# Patient Record
Sex: Female | Born: 1980 | Hispanic: Yes | Marital: Single | Smoking: Current every day smoker
Health system: Southern US, Community
[De-identification: ages and names within clinical notes are randomized; demographics above are authoritative.]

## PROBLEM LIST (undated history)

## (undated) ENCOUNTER — Inpatient Hospital Stay (HOSPITAL_COMMUNITY): Payer: Self-pay

## (undated) DIAGNOSIS — B009 Herpesviral infection, unspecified: Secondary | ICD-10-CM

## (undated) DIAGNOSIS — E079 Disorder of thyroid, unspecified: Secondary | ICD-10-CM

## (undated) DIAGNOSIS — E039 Hypothyroidism, unspecified: Secondary | ICD-10-CM

## (undated) DIAGNOSIS — F32A Depression, unspecified: Secondary | ICD-10-CM

## (undated) DIAGNOSIS — F99 Mental disorder, not otherwise specified: Secondary | ICD-10-CM

## (undated) DIAGNOSIS — O139 Gestational [pregnancy-induced] hypertension without significant proteinuria, unspecified trimester: Secondary | ICD-10-CM

## (undated) DIAGNOSIS — F329 Major depressive disorder, single episode, unspecified: Secondary | ICD-10-CM

## (undated) HISTORY — PX: CHOLECYSTECTOMY: SHX55

## (undated) HISTORY — DX: Hypothyroidism, unspecified: E03.9

## (undated) HISTORY — PX: DILATION AND CURETTAGE OF UTERUS: SHX78

## (undated) HISTORY — DX: Disorder of thyroid, unspecified: E07.9

---

## 2007-07-03 ENCOUNTER — Inpatient Hospital Stay (HOSPITAL_COMMUNITY): Admission: AD | Admit: 2007-07-03 | Discharge: 2007-07-03 | Payer: Self-pay | Admitting: Obstetrics & Gynecology

## 2007-07-03 ENCOUNTER — Other Ambulatory Visit: Payer: Self-pay | Admitting: Emergency Medicine

## 2007-07-06 ENCOUNTER — Inpatient Hospital Stay (HOSPITAL_COMMUNITY): Admission: AD | Admit: 2007-07-06 | Discharge: 2007-07-06 | Payer: Self-pay | Admitting: Gynecology

## 2007-07-13 ENCOUNTER — Inpatient Hospital Stay (HOSPITAL_COMMUNITY): Admission: AD | Admit: 2007-07-13 | Discharge: 2007-07-13 | Payer: Self-pay | Admitting: Obstetrics and Gynecology

## 2007-07-20 ENCOUNTER — Ambulatory Visit: Payer: Self-pay | Admitting: Obstetrics & Gynecology

## 2007-07-20 ENCOUNTER — Ambulatory Visit (HOSPITAL_COMMUNITY): Admission: AD | Admit: 2007-07-20 | Discharge: 2007-07-20 | Payer: Self-pay | Admitting: Obstetrics & Gynecology

## 2007-07-20 ENCOUNTER — Encounter: Payer: Self-pay | Admitting: Obstetrics & Gynecology

## 2007-07-27 ENCOUNTER — Inpatient Hospital Stay (HOSPITAL_COMMUNITY): Admission: AD | Admit: 2007-07-27 | Discharge: 2007-07-27 | Payer: Self-pay | Admitting: Obstetrics and Gynecology

## 2007-08-10 ENCOUNTER — Ambulatory Visit: Payer: Self-pay | Admitting: Family Medicine

## 2009-10-06 ENCOUNTER — Ambulatory Visit: Payer: Self-pay | Admitting: Family Medicine

## 2009-10-06 ENCOUNTER — Encounter: Payer: Self-pay | Admitting: Family Medicine

## 2009-10-06 LAB — CONVERTED CEMR LAB
Basophils Absolute: 0 10*3/uL (ref 0.0–0.1)
Hemoglobin: 12.9 g/dL (ref 12.0–15.0)
Hepatitis B Surface Ag: NEGATIVE
Lymphocytes Relative: 26 % (ref 12–46)
Monocytes Absolute: 0.4 10*3/uL (ref 0.1–1.0)
Neutro Abs: 4.9 10*3/uL (ref 1.7–7.7)
Platelets: 195 10*3/uL (ref 150–400)
RDW: 14.4 % (ref 11.5–15.5)

## 2009-10-13 ENCOUNTER — Encounter: Payer: Self-pay | Admitting: Family Medicine

## 2009-10-13 ENCOUNTER — Ambulatory Visit: Payer: Self-pay | Admitting: Family Medicine

## 2009-10-13 LAB — CONVERTED CEMR LAB
Chlamydia, DNA Probe: NEGATIVE
GC Probe Amp, Genital: NEGATIVE
Pap Smear: NEGATIVE
Whiff Test: NEGATIVE

## 2009-10-17 ENCOUNTER — Encounter: Payer: Self-pay | Admitting: *Deleted

## 2009-10-22 ENCOUNTER — Ambulatory Visit: Payer: Self-pay | Admitting: Family Medicine

## 2009-10-23 ENCOUNTER — Encounter: Payer: Self-pay | Admitting: Family Medicine

## 2009-10-24 ENCOUNTER — Encounter: Payer: Self-pay | Admitting: Family Medicine

## 2009-10-27 ENCOUNTER — Telehealth: Payer: Self-pay | Admitting: Family Medicine

## 2009-11-06 ENCOUNTER — Ambulatory Visit: Payer: Self-pay | Admitting: Family Medicine

## 2009-11-26 ENCOUNTER — Ambulatory Visit: Payer: Self-pay | Admitting: Family Medicine

## 2009-11-26 DIAGNOSIS — Z8619 Personal history of other infectious and parasitic diseases: Secondary | ICD-10-CM

## 2009-12-16 ENCOUNTER — Ambulatory Visit: Payer: Self-pay | Admitting: Family Medicine

## 2009-12-18 ENCOUNTER — Ambulatory Visit: Payer: Self-pay | Admitting: Family Medicine

## 2009-12-18 ENCOUNTER — Encounter: Payer: Self-pay | Admitting: Family Medicine

## 2010-01-02 ENCOUNTER — Encounter: Payer: Self-pay | Admitting: Family Medicine

## 2010-01-02 ENCOUNTER — Ambulatory Visit: Payer: Self-pay | Admitting: Family Medicine

## 2010-01-02 LAB — CONVERTED CEMR LAB
Hemoglobin: 12.9 g/dL (ref 12.0–15.0)
MCHC: 33.2 g/dL (ref 30.0–36.0)
MCV: 93.7 fL (ref 78.0–100.0)
RBC: 4.14 M/uL (ref 3.87–5.11)

## 2010-01-06 ENCOUNTER — Telehealth: Payer: Self-pay | Admitting: *Deleted

## 2010-01-16 ENCOUNTER — Ambulatory Visit: Payer: Self-pay | Admitting: Family Medicine

## 2010-01-19 ENCOUNTER — Telehealth: Payer: Self-pay | Admitting: *Deleted

## 2010-01-28 ENCOUNTER — Ambulatory Visit: Payer: Self-pay | Admitting: Family Medicine

## 2010-02-12 ENCOUNTER — Ambulatory Visit: Payer: Self-pay | Admitting: Family Medicine

## 2010-02-13 ENCOUNTER — Telehealth: Payer: Self-pay | Admitting: Family Medicine

## 2010-02-19 ENCOUNTER — Encounter: Payer: Self-pay | Admitting: Family Medicine

## 2010-02-19 ENCOUNTER — Ambulatory Visit: Payer: Self-pay | Admitting: Family Medicine

## 2010-02-20 ENCOUNTER — Telehealth: Payer: Self-pay | Admitting: Family Medicine

## 2010-02-26 ENCOUNTER — Encounter: Payer: Self-pay | Admitting: Family Medicine

## 2010-02-26 ENCOUNTER — Ambulatory Visit: Payer: Self-pay | Admitting: Family Medicine

## 2010-02-26 LAB — CONVERTED CEMR LAB
AST: 18 units/L (ref 0–37)
BUN: 5 mg/dL — ABNORMAL LOW (ref 6–23)
Calcium: 8.6 mg/dL (ref 8.4–10.5)
Chloride: 108 meq/L (ref 96–112)
Creatinine, Ser: 0.66 mg/dL (ref 0.40–1.20)
Glucose, Bld: 59 mg/dL — ABNORMAL LOW (ref 70–99)
HCT: 40.2 % (ref 36.0–46.0)
Hemoglobin: 13.5 g/dL (ref 12.0–15.0)
Nitrite: NEGATIVE
Protein, U semiquant: >=300
RBC: 4.32 M/uL (ref 3.87–5.11)
RDW: 13.5 % (ref 11.5–15.5)
Urobilinogen, UA: 0.2
WBC: 8.3 10*3/uL (ref 4.0–10.5)

## 2010-02-27 ENCOUNTER — Ambulatory Visit: Payer: Self-pay | Admitting: Family Medicine

## 2010-02-27 ENCOUNTER — Inpatient Hospital Stay (HOSPITAL_COMMUNITY)
Admission: AD | Admit: 2010-02-27 | Discharge: 2010-03-05 | Payer: Self-pay | Source: Home / Self Care | Admitting: Family Medicine

## 2010-02-27 ENCOUNTER — Telehealth: Payer: Self-pay | Admitting: Family Medicine

## 2010-03-02 ENCOUNTER — Encounter: Payer: Self-pay | Admitting: Obstetrics & Gynecology

## 2010-03-06 ENCOUNTER — Ambulatory Visit: Payer: Self-pay | Admitting: Family Medicine

## 2010-03-11 ENCOUNTER — Ambulatory Visit: Payer: Self-pay | Admitting: Family Medicine

## 2010-03-11 ENCOUNTER — Encounter: Payer: Self-pay | Admitting: Family Medicine

## 2010-03-11 LAB — CONVERTED CEMR LAB
AST: 19 units/L (ref 0–37)
Albumin: 3.6 g/dL (ref 3.5–5.2)
Alkaline Phosphatase: 139 units/L — ABNORMAL HIGH (ref 39–117)
Ketones, urine, test strip: NEGATIVE
Nitrite: NEGATIVE
Potassium: 4.1 meq/L (ref 3.5–5.3)
Protein, U semiquant: NEGATIVE
Sodium: 140 meq/L (ref 135–145)
Total Bilirubin: 0.4 mg/dL (ref 0.3–1.2)
Total Protein: 7.3 g/dL (ref 6.0–8.3)
Urobilinogen, UA: 0.2

## 2010-04-15 ENCOUNTER — Ambulatory Visit: Payer: Self-pay | Admitting: Family Medicine

## 2010-05-19 NOTE — Assessment & Plan Note (Signed)
Summary: ob visit/eo   Vital Signs:  Patient profile:   30 year old female Weight:      146.8 pounds BP sitting:   119 / 64  Vitals Entered By: Arlyss Repress CMA, (February 19, 2010 10:55 AM)  Primary Care Provider:  Clementeen Graham MD   History of Present Illness: Visit conducted in Spanish, in [facility] clinic.  [name] is a 30 yr old G4P0030 at 28 6/[redacted] weeks EGA, here today for followup OB visit. She denies changes in her social situation since last week's visit (also in OB clinic).  Still living with her sister's family, some tension in the household but feels safe there.  Does not want her sister present for the delivery.  There is no one whom she knows who will be present for her delivery.  Would be interested in doula service if one is available.  Discussed her history of OB US, which she had done at Dr Elsie Stain office.  The report of the July 8th [place] was requested last week; thus far I have not seen the report of that ultrasound.  Habits & Providers  Alcohol-Tobacco-Diet     Cigarette Packs/Day: n/a  Allergies: No Known Drug Allergies  Physical Exam  General:  well appearing, no apparent distress Genitalia:  normal vaginal mucosa, no lesions or vesicles.  Normal cervix, thin white discharge upon insertion of speculum for cervical culture collection.   Impression & Recommendations:  Problem # 1:  SUPERVISION OF OTHER NORMAL PREGNANCY (ICD-V22.1) G4P0030 at 35 6/7 weeks by LMP; she had an anatomy US done at Dr Whitney Post office on July 8th, will obtain copies of the reports form that study to verify dates.  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  Discussed that she is to call with any suspicious lesions or sensation of new outbreak.  In context of previous HSV2, we performed cervical cultures along with her routine GBS culture today.  For followup in 1 week with her primary, Dr [name].    Orders: Grp B Probe-[facility] ([id number]) GC/Chlamydia-[facility] ([id number]) Grp B Probe-[facility] ([id number])  Problem # 2:  HAND PAIN, BILATERAL (ICD-729.5) Bilateral carpal tunnel syndrome.  Continue using cock-up splints at night.  Tylenol as needed.    Complete Medication List: 1)  Prenatal/folic Acid Tabs (Prenatal vit-fe fumarate-fa) 2)  Acyclovir 400 Mg Tabs (Acyclovir) .... Sig: take 1 tab by mouth three times daily until delivery spanish lang instructions 3)  Clotrimazole 1 % Crea (Clotrimazole) .... Apply to affected skin twice daily for 7 days disp 1 large tube spanish language instructions  Patient Instructions: 1)  Fue un placer verle hoy.  Tiene 35 semanas con 6 dias de embarazo.  Todo parece estar encaminando bien. 2)  Hicimos colecta de cultivos del cuello de la matriz y de la piel alrededor de la vagina hoy.  3)  Pedimos su firma para recibir el reporte del [employer] hicieron en la oficina del Dr Gaynell Face el 8 de julio.  4)  Siga tomando la medicina acyclovir tres veces por dia, hasta que de' a luz.  Por favor llame si se le brotan lesiones que parecen ser de herpes.  5)  Cada manana y noche enfoquese en los movimientos del bebe.  Si siente por lo  menos cinco movimientos en una hora, no tiene que seguir contando.  Si no llega a cinco movimientos en [name],  siga contando por otra hora.  Si no llega a diez movimientos en estas dos horas, debe presentarse al [employer] de Emergencia de [facility] o llamar a Event organiser. 6)  FOLLOW UP OB VISIT WITH DR COREY IN 1 WEEK.   Orders Added: 1)  Grp B Probe-[facility] [[id number]] 2)  GC/Chlamydia-[facility] [[id number]] 3)  Grp B Probe-[facility] [[id number]]     OB Initial Intake Information    Positive HCG by: self    Race: Hispanic    Marital status: Single  FOB Information    Husband/Father of baby: [name]  Menstrual History    LMP (date): 06/13/2009    LMP - Character: normal    Menarche: 11 years    Menses  interval: 30 days    Menstrual flow 3-8 days    On BCP's at conception: no    Date of positive (+) home preg. test: 09/17/2009   Flowsheet View for Follow-up Visit    Estimated weeks of       gestation:     35 6/7    Weight:     146.8    Blood pressure:   119 / 64    Headache:     No    Nausea/vomiting:   No    Edema:     0    Vaginal bleeding:   no    Vaginal discharge:   no    Fundal height:      36    FHR:       130    Fetal activity:     yes    Labor symptoms:   no    Fetal position:     vertex    Taking prenatal vits?   Y    Smoking:     n/a    Next visit:     1 wk    Preceptor:     Mauricio Po    Comment:     see CPOE    Flowsheet View for Follow-up Visit    Estimated weeks of       gestation:     35 6/7    Weight:     146.8    Blood pressure:   119 / 64    Hx headache?     No    Nausea/vomiting?   No    Edema?     0    Bleeding?     no    Leakage/discharge?   no    Fetal activity:       yes    Labor symptoms?   no    Fundal height:      36    FHR:       130    Fetal position:      vertex    Taking Vitamins?   Y    Smoking PPD:   n/a    Comment:     see CPOE    Next visit:     1 wk    Preceptor:     Mauricio Po  Appended Document: ob visit/eo    Clinical Lists Changes  Orders: Added new Test order of Other OB visit- [facility]) - Signed

## 2010-05-19 NOTE — Assessment & Plan Note (Signed)
Summary: ob/aam/kh   Vital Signs:  Patient profile:   30 year old female Weight:      128.1 pounds Pulse rate:   74 / minute BP sitting:   88 / 50  Vitals Entered By: Garen Grams LPN (November 26, 2009 9:37 AM)  Primary Care Provider:  Clementeen Graham MD  CC:  OB visit.  History of Present Illness: Ms C-M presents to clinic today for a 23and 5 week OB visit. In the interum she has been seen by [facility] and sent back to [facility] for further care. No special intervantion or monitoring required.  However no reccord were sent. Calling to obtain today. Also had an anatomy scan which was normal per patient. She will be having a girl named Macao.  No reccords sent and are called to obtain now.  No red flag symptoms. Feels well and no issues. Continues to work. Happy and healthy.   Habits & Providers  Alcohol-Tobacco-Diet     Alcohol drinks/day: 0     Tobacco Status: never     Cigarette Packs/Day: n/a  Current Problems (verified): 1)  Genital Herpes, Hx of  (ICD-V13.8) 2)  Pregnancy With History of Abortion  (ICD-V23.2)  Current Medications (verified): 1)  Prenatal/folic Acid  Tabs (Prenatal Vit-Fe Fumarate-Fa)  Allergies (verified): No Known Drug Allergies  Past History:  Past Surgical History: Last updated: 10/13/2009 None  Social History: Last updated: 11/26/2009 Has been in the [place] for a few years. FOB/Partner was deported few months ago.   Currently living with her sisters and aunts.  Feels safe at home and well loved.  Works.   Past Medical History: 4 SABs 2 requiring D&C.  Genital hepes last outbreak 6 years ago. "were removed with lazer" question Condoloyma? Othwerwise normal.   Family History: Reviewed history from 10/13/2009 and no changes required. None  Social History: Has been in the [place] for a few years. FOB/Partner was deported few months ago.   Currently living with her sisters and aunts.  Feels safe at home and well loved.  Works.   Review of Systems  The  patient denies anorexia, weight loss, chest pain, syncope, dyspnea on exertion, peripheral edema, headaches, abdominal pain, incontinence, genital sores, transient blindness, difficulty walking, unusual weight change, and enlarged lymph nodes.    Physical Exam  General:  VS noted.  Well woman with NAD Mouth:  Oral mucosa and oropharynx without lesions or exudates.  Teeth in good repair. Lungs:  Normal respiratory effort, chest expands symmetrically. Lungs are clear to auscultation, no crackles or wheezes. Heart:  Normal rate and regular rhythm. S1 and S2 normal without gallop, murmur, click, rub or other extra sounds. Abdomen:  Gravid 23 cm Extremities:  Non edemetus BL LE   Impression & Recommendations:  Problem # 1:  PREGNANCY WITH HISTORY OF ABORTION (ICD-V23.2) Assessment Unchanged  Doing well. Obtaining reccords from [facility] and Dr. Trudie Buckler to confirm no intervention required.  Plan to follow up in 2-3 week (28 weeks) for Glucola.  Will initiate Valtrex 500 daily at 34 weeks.  Normal prenatal care.   Orders: Other OB visit- [facility] ([facility])  Complete Medication List: 1)  Prenatal/folic Acid Tabs (Prenatal vit-fe fumarate-fa)  Patient Instructions: 1)  Thank you for seeing me today. 2)  Return to clinic in 3 weeks.  3)  Make your appt for early in the clinic. 4)  Cada manana y noche enfoquese en los movimientos del bebe.  Si siente por lo  menos cinco movimientos en [place]  hora, no tiene que seguir contando.  Si no llega a cinco movimientos en una hora, siga contando por otra hora.  Si no llega a diez movimientos en estas dos horas, debe presentarse al [employer] de Emergencia de [facility] o llamar a Event organiser.   OB Initial Intake Information    Positive HCG by: self    Race: Hispanic    Marital status: Single  FOB Information    Husband/Father of baby: [name]  Menstrual History    LMP (date): 06/13/2009    LMP - Character: normal    Menarche: 11 years     Menses interval: 30 days    Menstrual flow 3-8 days    On BCP's at conception: no    Date of positive (+) home preg. test: 09/17/2009   Flowsheet View for Follow-up Visit    Estimated weeks of       gestation:     23 5/7    Weight:     128.1    Blood pressure:   88 / 50    Headache:     No    Nausea/vomiting:   No    Edema:     0    Vaginal bleeding:   no    Vaginal discharge:   no    Fundal height:      23    FHR:       140    Fetal activity:     yes    Labor symptoms:   no    Fetal position:     N/A    Taking prenatal vits?   Y    Smoking:     n/a    Next visit:     2-3 weeks    Resident:     [name]    Preceptor:     Mauricio Po   Appended Document: ob/aam/kh u/s results placed in pcp box also placed in to be scanned slot

## 2010-05-19 NOTE — Assessment & Plan Note (Signed)
Summary: ob visit/eo   Vital Signs:  Patient profile:   30 year old female Weight:      131 pounds Temp:     99.0 degrees F oral Pulse rate:   87 / minute BP sitting:   97 / 62  Vitals Entered By: Jimmy Footman, CMA (December 16, 2009 1:47 PM) CC: OB 26   4/7.   Comments Has started to have some upper back pain   Primary Care Jarmon Javid:  Clementeen Graham MD  CC:  OB 26   4/7.  Marland Kitchen  History of Present Illness: Doing well at 26 and 4/7 weeks.  Only complaint is thoracic back pain. Mild Notes with exertion and duration of 1 week. Better with stretching and rest. Has not tried anything for it yet. Lifts up tto 50 lbs at work. But feels isnt straning much. No other issues.  See flowsheet.   Habits & Providers  Alcohol-Tobacco-Diet     Alcohol drinks/day: 0     Tobacco Status: never     Cigarette Packs/Day: n/a  Current Problems (verified): 1)  Genital Herpes, Hx of  (ICD-V13.8) 2)  Pregnancy With History of Abortion  (ICD-V23.2)  Current Medications (verified): 1)  Prenatal/folic Acid  Tabs (Prenatal Vit-Fe Fumarate-Fa)  Allergies (verified): No Known Drug Allergies  Past History:  Past Medical History: Last updated: 11/26/2009 4 SABs 2 requiring D&C.  Genital hepes last outbreak 6 years ago. "were removed with lazer" question Condoloyma? Othwerwise normal.   Past Surgical History: Last updated: 10/13/2009 None  Social History: Last updated: 11/26/2009 Has been in the [place] for a few years. FOB/Partner was deported few months ago.   Currently living with her sisters and aunts.  Feels safe at home and well loved.  Works.   Risk Factors: Smoking Status: never (12/16/2009) Packs/Day: n/a (12/16/2009)  Family History: Reviewed history from 10/13/2009 and no changes required. None  Social History: Reviewed history from 11/26/2009 and no changes required. Has been in the [place] for a few years. FOB/Partner was deported few months ago.   Currently living with her sisters and  aunts.  Feels safe at home and well loved.  Works.   Review of Systems       See HPI otherwsie neg  Physical Exam  General:  Vs noted.  Well gravid woman in NAD Lungs:  Normal respiratory effort, chest expands symmetrically. Lungs are clear to auscultation, no crackles or wheezes. Heart:  Normal rate and regular rhythm. S1 and S2 normal without gallop, murmur, click, rub or other extra sounds. Abdomen:  Gravid 27 cm Msk:  Mild TTP across the toracic back especially in the paraspinus muscle groups and Bl romboids.  Normal scapular and toracic ROM.  No spinus process TTP Extremities:  Non edemetus BL LE   Impression & Recommendations:  Problem # 1:  PREGNANCY WITH HISTORY OF ABORTION (ICD-V23.2) Assessment Unchanged Doing well. No issues. Plan 1 hr GTT today and Visit at 28 weeks for HIV/RPR testing then.  Will follow.  Advised tylenol for minor MSK pain.  Red flags reviewed.   Orders: Glucose 1 hr-[facility] ([id number]) Other OB visit- [facility] ([facility])  Complete Medication List: 1)  Prenatal/folic Acid Tabs (Prenatal vit-fe fumarate-fa)  Patient Instructions: 1)  Thank you for seeing me today. 2)  Please schedule a follow-up appointment in 2 weeks. 3)  Si tiene sangrado vaginal, o si sospecha que se le ha roto la fuente (chorro fuerte o un goteo leve de la vagina) o si tiene English as a second language teacher  que vienen mas de cada cinco minutos por una horas seguidas, por favor presentese al Narda Rutherford de Emergencia de [facility]. 4)  Cada manana y noche enfoquese en los movimientos del bebe.  Si siente por lo  menos cinco movimientos en una hora, no tiene que seguir contando.  Si no llega a cinco movimientos en una hora, siga contando por otra hora.  Si no llega a diez movimientos en estas dos horas, debe presentarse al [employer] de Emergencia de [facility] o llamar a Event organiser.   OB Initial Intake Information    Positive HCG by: self    Race: Hispanic    Marital status: Single  FOB  Information    Husband/Father of baby: [name]  Menstrual History    LMP (date): 06/13/2009    LMP - Character: normal    Menarche: 11 years    Menses interval: 30 days    Menstrual flow 3-8 days    On BCP's at conception: no    Date of positive (+) home preg. test: 09/17/2009   Flowsheet View for Follow-up Visit    Estimated weeks of       gestation:     26 4/7    Weight:     131    Blood pressure:   97 / 62    Headache:     No    Nausea/vomiting:   No    Edema:     0    Vaginal bleeding:   no    Vaginal discharge:   no    Fundal height:      27    FHR:       145    Fetal activity:     yes    Labor symptoms:   no    Fetal position:     breech    Taking prenatal vits?   Y    Smoking:     n/a    Next visit:     2 wk    Resident:     [name]    Preceptor:     [facility] for Follow-up Visit    Estimated weeks of       gestation:     26 4/7    Weight:     131    Blood pressure:   97 / 62    Hx headache?     No    Nausea/vomiting?   No    Edema?     0    Bleeding?     no    Leakage/discharge?   no    Fetal activity:       yes    Labor symptoms?   no    Fundal height:      27    FHR:       145    Fetal position:      breech    Taking Vitamins?   Y    Smoking PPD:   n/a    Next visit:     2 wk    Resident:     [name]    Preceptor:     Jennette Kettle

## 2010-05-19 NOTE — Consult Note (Signed)
Summary: [name]   Imported By: Bradly Bienenstock 03/06/2010 16:33:55  _____________________________________________________________________  External Attachment:    Type:   Image     Comment:   External Document

## 2010-05-19 NOTE — Progress Notes (Signed)
  Phone Note Outgoing Call   Call placed by: Paula Compton MD,  February 13, 2010 1:00 PM Call placed to: Patient Summary of Call: Call completed in Spanish. Spoke with patient.  I am unable to locate a report for [place] study.  She had an [place] at Dr. Elsie Stain office on July 8th, 2011 (shortly before going to [facility]).  We will request a copy of the report from Dr Elsie Stain office.  Initial call taken by: Paula Compton MD,  February 13, 2010 1:02 PM  Follow-up for Phone Call        called and they faxed it .  will be scanned in. Follow-up by: De Nurse,  February 13, 2010 3:20 PM

## 2010-05-19 NOTE — Progress Notes (Signed)
  Phone Note Outgoing Call   Call placed by: Paula Compton MD,  February 20, 2010 8:42 AM Call placed to: Patient Summary of Call: Called [phone],  phone conversation with patient in Spanish.  I informed her that her cervical cultures (GC/Chlamydia) are negative.  She has no questions.  Initial call taken by: Paula Compton MD,  February 20, 2010 8:43 AM

## 2010-05-19 NOTE — Assessment & Plan Note (Signed)
Summary: pp ck,df   Vital Signs:  Patient profile:   30 year old female Weight:      128 pounds Pulse rate:   73 / minute BP sitting:   115 / 73  (left arm) Cuff size:   regular  Vitals Entered By: Tessie Fass CMA (March 11, 2010 9:15 AM) CC: postpartum check Pain Assessment Patient in pain? no        Primary Care Provider:  Clementeen Graham MD  CC:  postpartum check.  History of Present Illness: [name] presents back to [facility] today for a post partum check. She was recently discharged from [facility] following a C-section and induction for pre-eclampsia and protinurea.  She feels well today and notes that her swelling in her feet is much decreased. She denies any further headaches or vision changes. Her breasts are no longer firm or tender. She feels well.   Habits & Providers  Alcohol-Tobacco-Diet     Alcohol drinks/day: 0     Tobacco Status: never     Cigarette Packs/Day: n/a  Current Problems (verified): 1)  Pre-eclampsia  (ICD-642.40) 2)  Proteinuria  (ICD-791.0) 3)  Abnormal Maternal Glucose Tolerance Antepartum  (ZOX-096.04) 4)  Genital Herpes, Hx of  (ICD-V13.8) 5)  Pregnancy With History of Abortion  (ICD-V23.2)  Current Medications (verified): 1)  Prenatal/folic Acid  Tabs (Prenatal Vit-Fe Fumarate-Fa)  Allergies (verified): No Known Drug Allergies  Past History:  Social History: Last updated: 11/26/2009 Has been in the [place] for a few years. FOB/Partner was deported few months ago.   Currently living with her sisters and aunts.  Feels safe at home and well loved.  Works.   Risk Factors: Smoking Status: never (03/11/2010) Packs/Day: n/a (03/11/2010)  Past Medical History: Preeclampsia with induction at 37 weeks in 02/2010 4 SABs 2 requiring D&C.  Genital hepes last outbreak 6 years ago. "were removed with lazer" question Condoloyma?  Past Surgical History: Primary LTCS 02/2010  Review of Systems  The patient denies anorexia, fever, weight  gain, chest pain, headaches, abdominal pain, and genital sores.    Physical Exam  General:  VS noted.  Well NAD Lungs:  Normal respiratory effort, chest expands symmetrically. Lungs are clear to auscultation, no crackles or wheezes. Heart:  Normal rate and regular rhythm. S1 and S2 normal without gallop, murmur, click, rub or other extra sounds. Abdomen:  Incision is CDI and fundus is firm below the umbilicus.  Extremities:  1+ edema in BL LE now   Impression & Recommendations:  Problem # 1:  PRE-ECLAMPSIA (ICD-642.40) Assessment Improved Much improved clinically.  Will check UA and CMP today for improvement.  Will follow up in 4 weeks at 6 weeks check.    Orders: Urinalysis-[facility] (00000) Comp Met-[facility] ([id number])  Problem # 2:  PREGNANCY WITH HISTORY OF ABORTION (ICD-V23.2) Assessment: Improved  Doing well. Incision is CDI. Looks good.   Orders: Postpartum visit- [facility] [phone])  Complete Medication List: 1)  Prenatal/folic Acid Tabs (Prenatal vit-fe fumarate-fa)   Orders Added: 1)  Urinalysis-[facility] [00000] 2)  Comp Met-[facility] [[id number]] 3)  Postpartum visit- [facility] [[id number]]    Laboratory Results   Urine Tests  Date/Time Received: March 11, 2010 9:12 AM  Date/Time Reported: March 11, 2010 10:43 AM   Routine Urinalysis   Color: yellow Appearance: Clear Glucose: negative   (Normal Range: Negative) Bilirubin: negative   (Normal Range: Negative) Ketone: negative   (Normal Range: Negative) Spec. Gravity: 1.020   (Normal Range: 1.003-1.035) Blood: moderate   (Normal Range:  Negative) pH: 7.0   (Normal Range: 5.0-8.0) Protein: negative   (Normal Range: Negative) Urobilinogen: 0.2   (Normal Range: 0-1) Nitrite: negative   (Normal Range: Negative) Leukocyte Esterace: negative   (Normal Range: Negative)  Urine Microscopic WBC/HPF: 1-3 RBC/HPF: 0-3 Bacteria/HPF: trace Epithelial/HPF: 1-3    Comments: ...............test performed by......Marland KitchenBonnie A. [place],  MLS (ASCP)cm

## 2010-05-19 NOTE — Assessment & Plan Note (Signed)
Summary: OB/MJ(resch'd from 11/1)bmc   Vital Signs:  Patient profile:   30 year old female Weight:      142 pounds Pulse rate:   69 / minute BP sitting:   108 / 67  (left arm) Cuff size:   regular  Vitals Entered By: Arlyss Repress CMA, (February 12, 2010 11:52 AM)  Primary Care Provider:  Clementeen Graham MD   History of Present Illness: Visit conducted in Spanish.  [name] reports feeling well physically; good fetal movement.  Has been having tension with her sister, who wants [name] to care for sister's children ([name]'s nephews).  Husband was deported.   Discussed PHQ9 screen, which is scored at 13, with zero points for question #9.   Continues to take PNVs.   Passed 3hrGTT.  Has had 3 SABs in first trimester in the past; Factor V Leiden negative recently.  Denies bleeding/discharge at this time.  Reports that she has had some irritation and redness in the skin around the vagina/vulva.  Not sexually active now.   Habits & Providers  Alcohol-Tobacco-Diet     Cigarette Packs/Day: n/a  Allergies: No Known Drug Allergies  Physical Exam  Genitalia:  mild erythema of skin around labia majora, inguinal creases.  No visible vesicles or mucosal lesions in vagina.   Impression & Recommendations:  Problem # 1:  SUPERVISION OF OTHER NORMAL PREGNANCY (ICD-V22.1)  Seen today in OB clinic, at 34 6/[redacted] weeks EGA by LMP 06/13/2009.  I am unable to locate an [place] for this patient; she seems certain of her LMP with regular cycles.  Several stressors in her life, including recent deportation of her husband, disputes with her sister with whom she lives.  Her PHQ9 score is 13, zero points for Q#9.  Discussed treatment for depression, she is not inclined toward SSRI.  Continue PNV.  For GBS cx at next visit.   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  No outbreaks in several years.  No lesions seen on visual inspection during  today's visit.  Discussed suppressive therapy, will start acyclovir 400mg  three times daily until delivery. She is to let us know if she believes she is having an outbreak.  Complete Medication List: 1)  Prenatal/folic Acid Tabs (Prenatal vit-fe fumarate-fa) 2)  Acyclovir 400 Mg Tabs (Acyclovir) .... Sig: take 1 tab by mouth three times daily until delivery spanish lang instructions 3)  Clotrimazole 1 % Crea (Clotrimazole) .... Apply to affected skin twice daily for 7 days disp 1 large tube spanish language instructions  Patient Instructions: 1)  Fue un placer  verle hoy. Tiene 34 semanas con 6 dias de embarazo (fecha anticipada de parto diciembre 3, 2011). 2)  Mande' una receta para una medicina antiviral que se llama Acyclovir 400mg ; tome una tableta tres veces por dia, hasta dar a luz.  Esta medicina es para la prevencion de un brote de herpes, que pudiera representar un peligro para su bebe.  3)  Tambien mande' una receta para una crema, clotrimazole, para poner en la piel afectada dos veces por dia, por 7 dias.  4)  Cada manana y noche enfoquese en los movimientos del bebe.  Si siente por lo  menos cinco movimientos en una hora, no tiene que seguir contando.  Si no llega a cinco movimientos en una hora, siga contando por otra hora.  Si no llega a diez [employer],  debe presentarse al Narda Rutherford de Emergencia de [facility] o llamar a Event organiser. 5)  Debe volver a  ver al Dr [name] en 1 semana 6)  OB VISIT WITH DR COREY IN 1 WEEK. Prescriptions: CLOTRIMAZOLE 1 % CREA (CLOTRIMAZOLE) Apply to affected skin twice daily for 7 days DISP 1 large tube Spanish language instructions  #1 x 1   Entered and Authorized by:   Paula Compton MD   Signed by:   Paula Compton MD on 02/12/2010   Method used:   Electronically to        [facility] [address] (retail)       [phone]       [place], [place]  [address]       Ph: [phone]       Fax:  [phone]   RxID:   [id number] ACYCLOVIR 400 MG TABS (ACYCLOVIR) SIG: Take 1 tab by mouth three times daily until delivery Spanish lang instructions  #120 x 0   Entered and Authorized by:   Paula Compton MD   Signed by:   Paula Compton MD on 02/12/2010   Method used:   Electronically to        [facility] [address] (retail)       [phone]       [place], [place]  [address]       Ph: [phone]       Fax: [phone]   RxID:   [id number]    Orders Added: 1)  Other OB visit- [facility] [OBCK]     Flowsheet View for Follow-up Visit    Estimated weeks of       gestation:     34 6/7    Weight:     142    Blood pressure:   108 / 67    Hx headache?     No    Nausea/vomiting?   No    Bleeding?     no    Leakage/discharge?   no    Fetal activity:       yes    Labor symptoms?   no    Fundal height:      34    FHR:       140    Taking Vitamins?   Y    Smoking PPD:   n/a    Comment:     see CPOE    Preceptor:     Mauricio Po

## 2010-05-19 NOTE — Assessment & Plan Note (Signed)
Summary: Ob visit/MJ/Corey   Vital Signs:  Patient profile:   30 year old female Weight:      136 pounds Pulse rate:   80 / minute BP sitting:   106 / 64  (left arm) Cuff size:   regular  Vitals Entered By: Tessie Fass CMA (January 16, 2010 10:32 AM) CC: OB visit   Primary Care Provider:  Clementeen Graham MD  CC:  OB visit.  History of Present Illness: Visit conducted in [place].  Reports good fetal movement, no ctx.  Some white vaginal discharge that has continued since last visit.  Took Diflucan 150mg  x1, no real change.  Results of wet prep and cervical culture reviewed from last visit.  No dysuria or changes in urinary patterns.   Reviewed prior pregnancy history (see CPOE).  All prior losses (4) occurred before she knew she was pregnant, not able to give estimates of gestational age.  Most recent spont Ab was here in [facility], records from [place] reviewed and corroborated with patient.  Other losses in [place], [place].   Currently living with sister, getting along okay.  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  She gets tearful but does not feel that she is depressed.  Has another sister, with whom she does not get along.  Feels she has a good social support with her sister and family.   REports some swelling/tightness sensation in hands bilaterally. None in face, no headaches.  no ankle edema noted.   Habits & Providers  Alcohol-Tobacco-Diet     Cigarette Packs/Day: n/a  Allergies: No Known Drug Allergies  Physical Exam  General:  well appearing, No apparent distress Msk:  No notable hand edema.  Negative Tinnels and Phalens tests bilaterally. Palpable radial pulses, firm and symmetric handgrip bilateral   Impression & Recommendations:  Problem # 1:  PREGNANCY WITH HISTORY OF ABORTION (ICD-V23.2) Patient is G5P0040 at 31 0/7 weeks today.  She has had three spontaneous pregnancy losses in [place], [place]; all three spontaneous Abs occurred before she knew she was  pregnant.  Unable to estimate gestational ages.  First loss in 2001 ([place]), required D&C.  Subsequent spont Abs in 2004, 2006 in [place].  Another in March/April 2009 in [facility], resulted in [facility] April 2009.  Ultrasound reviewed in E-chart, estimated fetal age of 7 weeks 5 days.   By history, would appear that losses likely in first trimester.  Will check Factor V Leiden today; previous hypercoag workup reviewed.  Has been evaluated by [facility], will communicate results of Factor V Leiden with them if positive.   Repeat visit in 2 weeks with Dr [name]. Orders: Miscellaneous Lab Charge-[facility] [id number]) Other OB visit- [facility] (OBCK)  Problem # 2:  LEUKORRHEA (ICD-623.5) Wet prep from last visit with many yeast, negative clue cells.  Cervical cx is negative.  Will re-treat with Diflucan 150mg  by mouth x1; consider whether represents physiologic discharge.  Consider repeat wet prep if not resolving, or if changes.   Complete Medication List: 1)  Prenatal/folic Acid Tabs (Prenatal vit-fe fumarate-fa) 2)  Fluconazole 150 Mg Tabs (Fluconazole) .Marland Kitchen.. 1 by mouth for infection  Other Orders: Influenza Vaccine NON MCR ([id number])  Patient Instructions: 1)  Fue un placer verle hoy.  Esta' con 31 semanas y 0 dias hoy.  2)  Le estoy recetando fluconazole 150mg  a la Corporate investment banker de [address] en [employer].  Tome una sola pastilla hoy; puede tomar una segunda pastilla en una semana si no se le ha resuelto el desecho. 3)  Estoy chequeando una prueba de sangre hoy; quiero que vuelva en 2 semanas con el Dr [name].  4)  FOLLOWUP OB WITH DR COREY IN 2 WEEKS. Prescriptions: FLUCONAZOLE 150 MG TABS (FLUCONAZOLE) 1 by mouth for infection  #1 x 0   Entered and Authorized by:   Paula Compton MD   Signed by:   Paula Compton MD on 01/16/2010   Method used:   Electronically to        [facility] [address] (retail)       [phone]       [place], [place]  [address]       Ph: [phone]        Fax: [phone]   RxID:   [id number]    Flowsheet View for Follow-up Visit    Estimated weeks of       gestation:     31 0/7    Weight:     136    Blood pressure:   106 / 64    Hx headache?     No    Nausea/vomiting?   No    Edema?     TrLE    Bleeding?     no    Leakage/discharge?   d/c    Fetal activity:       yes    Labor symptoms?   no    Fundal height:      31    FHR:       120s    Taking Vitamins?   Y    Smoking PPD:   n/a    Comment:     Factor V Leiden    Next visit:     2 wk    Preceptor:     Mauricio Po   Influenza Vaccine    Vaccine Type: Fluvax Non-MCR    Site: right deltoid    Mfr: GlaxoSmithKline    Dose: 0.5 ml    Route: IM    Given by: Rochele Pages, RN    Exp. Date: 10/13/2010    Lot #: [id number]    VIS given: 11/11/09 version given January 16, 2010.  Flu Vaccine Consent Questions    Do you have a history of severe allergic reactions to this vaccine? no    Any prior history of allergic reactions to egg and/or gelatin? no    Do you have a sensitivity to the preservative Thimersol? no    Do you have a past history of Guillan-Barre Syndrome? no    Do you currently have an acute febrile illness? no    Have you ever had a severe reaction to latex? no    Vaccine information given and explained to patient? yes    Are you currently pregnant? no

## 2010-05-19 NOTE — Assessment & Plan Note (Signed)
Summary: NOB/AAM/DSL   Vital Signs:  Patient profile:   30 year old female LMP:     06/13/2009 Weight:      119 pounds BP sitting:   100 / 61  Vitals Entered By: Jimmy Footman, CMA (October 13, 2009 3:43 PM)  Primary Care Provider:  Clementeen Graham MD  CC:  NOB.  History of Present Illness: [name] presents to clinic today for a new OB visit. However she comes without an interperetor. Dr. Alfonse Ras was able to translate today however we shortened the visit.  Skipped the lengthy intake information with a plan to follow it up at the next visit. [id number] (2 incomplete SABS requiring DNC, and 2 early SABS). LMP 06/13/09 regular cycles. Stoped taking the pill. Had one episode of spotting in April that has not persisted. She has occasional shart 3 second abdominal pain.  She denies any dysurea but does note some vaginal itching and whiteish discharge.    Overall she is a bit nervous as she has had several SABs.   Habits & Providers  Alcohol-Tobacco-Diet     Alcohol drinks/day: 0     Tobacco Status: never     Cigarette Packs/Day: n/a  Current Medications (verified): 1)  Prenatal/folic Acid  Tabs (Prenatal Vit-Fe Fumarate-Fa)  Allergies (verified): No Known Drug Allergies  Past History:  Past Medical History: 4 SABs 2 requiring DNC.  However this information is somewhat uncertain given the time constrains. Will f/u this issue with a formal interview in 1 week. Othwerwise normal.   Past Surgical History: None  Family History: None  Social History: Has been in the [place] for a few years. FOB/Partner was deported 3 months ago.   Currently living with her sisters and aunts.  Feels safe at home and well loved.  Works. Smoking Status:  never Packs/Day:  n/a  Review of Systems  The patient denies fever, weight loss, vision loss, chest pain, syncope, dyspnea on exertion, peripheral edema, headaches, abdominal pain, severe indigestion/heartburn, hematuria, muscle weakness, transient  blindness, difficulty walking, depression, breast masses, and abnormal bleeding.    Physical Exam  General:  VS noted.  Well gravid woman in NAD Eyes:  EOMI, PERRL Mouth:  MMM Lungs:  Normal respiratory effort, chest expands symmetrically. Lungs are clear to auscultation, no crackles or wheezes. Heart:  Normal rate and regular rhythm. S1 and S2 normal without gallop, murmur, click, rub or other extra sounds. Abdomen:  Bowel sounds positive,abdomen soft and non-tender without masses, organomegaly or hernias noted. Gravid Belly Genitalia:  Normal introitus for age, no external lesions, no vaginal discharge, mucosa pink and moist, no vaginal or cervical lesions, no vaginal atrophy, no friaility or hemorrhage. White discharge noted on vaginal walls. Normal uterus size and position for dates (17cm) , no adnexal masses or tenderness Extremities:  Non edemetus BL LE   Impression & Recommendations:  Problem # 1:  PREGNANCY, NORMAL (ICD-V22.2) Will complete full history next week with the adopt a mom translator.  Multiple SABs are concerning.  Unclear if TABs or SABs.  Will flesh this out with trnaslator. If >3 SABs will work this up with antiphosolipid antibidies and consider High Risk OB referral.  Otherwise doing well and active baby.  Orders: GC/Chlamydia-[facility] ([id number]) Pap Smear-[facility] ([id number]) Other OB visit- [facility] (OBCK)  Problem # 2:  VAGINAL DISCHARGE (ICD-623.5) Wet prep came back positive for yeast after pt left. Will call and ask to use OTC yeast infection kit. Will f.u this issue at next visit.  [facility]  [facility] [phone])  Complete Medication List: 1)  Prenatal/folic Acid Tabs (Prenatal vit-fe fumarate-fa)  Patient Instructions: 1)  Thank you for seeing me today. 2)  Si tiene sangrado vaginal, o si sospecha que se le ha roto la fuente (chorro fuerte o un goteo leve de la vagina) o si tiene contracciones que vienen mas de cada cinco minutos por una hora seguidas, por  favor presentese al Narda Rutherford de Emergencia de [facility]. 3)  Appointment 10-22-09 3:15 PM   OB Initial Intake Information    Race: Hispanic    Marital status: Single  Menstrual History    LMP (date): 06/13/2009    EDC by LMP: 03/20/2010    Best Working EDC: 03/20/2010   Flowsheet View for Follow-up Visit    Estimated weeks of       gestation:     17 3/7    Weight:     119    Blood pressure:   100 / 61    Headache:     No    Nausea/vomiting:   No    Edema:     0    Vaginal bleeding:   no    Vaginal discharge:   d/c    Fundal height:      17    FHR:       150    Cx Dilation:     0    Cx Effacement:   0%    Cx Station:     high    Taking prenatal vits?   Y    Smoking:     n/a    Next visit:     1 wk    Resident:     [name]    Preceptor:     [name] Initial Intake Information    Race: Hispanic    Marital status: Single  Menstrual History    LMP (date): 06/13/2009    EDC by LMP: 03/20/2010    Best Working EDC: 03/20/2010    Laboratory Results  Date/Time Received: October 13, 2009 4:16 PM  Date/Time Reported: October 13, 2009 4:25 PM   Wet [place] Source: vag WBC/hpf: >20 Bacteria/hpf: 3+  Rods Clue cells/hpf: none  Negative whiff Yeast/hpf: moderate Trichomonas/hpf: none Comments: ...............test performed by......Marland KitchenBonnie A. [place], MLS (ASCP)cm    Prenatal Visit Concerns noted: Appears to have multiple SABs.  Will f.u this in 1 week with translator. EDC Confirmation:    New working [facility]: 03/20/2010    Last menses onset (LMP) date: 06/13/2009    EDC by LMP: 03/20/2010

## 2010-05-19 NOTE — Progress Notes (Signed)
  Phone Note Other Incoming Call back at [phone]   Caller: Josie-Dr. Elsie Stain office Summary of Call: Want to make sure you received the copies of the ultrasound results on pt.  Please call office if you did not get the two pages. Initial call taken by: Abundio Miu,  February 27, 2010 2:25 PM  Follow-up for Phone Call        we only recieved one page . Dr. [name] ask that Dr. Elsie Stain office mail ultrasound report to [place] because of poor quality of fax. Dr. Elsie Stain office is closed this afternoon will call Monday. Follow-up by: Theresia Lo RN,  February 27, 2010 4:10 PM

## 2010-05-19 NOTE — Assessment & Plan Note (Signed)
Summary: OB/AAM/KH   Vital Signs:  Patient profile:   30 year old female Weight:      133 pounds Pulse rate:   79 / minute BP sitting:   93 / 60  (left arm)  Vitals Entered By: Tessie Fass CMA (January 02, 2010 11:24 AM) CC: OB Visit   Primary Care Provider:  Clementeen Graham MD  CC:  OB Visit.  History of Present Illness:   Thick yellow vaginal discharge x 1 week, +pruritic, no foul smell , not sexually active since Husband has been deported  Habits & Providers  Alcohol-Tobacco-Diet     Cigarette Packs/Day: n/a  Current Medications (verified): 1)  Prenatal/folic Acid  Tabs (Prenatal Vit-Fe Fumarate-Fa) 2)  Fluconazole 150 Mg Tabs (Fluconazole) .Marland Kitchen.. 1 By Mouth For Infection  Allergies (verified): No Known Drug Allergies  Physical Exam  General:  Vital signs noted NAD Genitalia:  Normal introitus for age, no external lesions Thick yellow discharge- copious amount, no odor, no CMT   Impression & Recommendations:  Problem # 1:  SUPERVISION OF OTHER NORMAL PREGNANCY (ICD-V22.1) Assessment Unchanged  29 y.o. G 5P040  at 29 weeks, progressing well with pregnancy, difficult as husband deported. Pt is illegal immigrant has WIC services, will defer to PCP to see if any other referral made, esp in setting of multiple miscarriages ([employer], project [employer]). Passed 3 hour GGT, 28 week labs done  Female Birth control- no decision yet Orders: CBC-[facility] ([id number]) HIV-[facility] ([id number]) RPR-[facility] ([id number]) Other OB visit- [facility] (OBCK)  Problem # 2:  CANDIDIASIS OF VULVA AND VAGINA (ICD-112.1) Assessment: New  Treat with fluconazole  GC/Chlamydia pending  Her updated medication list for this problem includes:    Fluconazole 150 Mg Tabs (Fluconazole) .Marland Kitchen... 1 by mouth for infection  Orders: Other OB visit- [facility] (OBCK)  Complete Medication List: 1)  Prenatal/folic Acid Tabs (Prenatal vit-fe fumarate-fa) 2)  Fluconazole 150 Mg Tabs (Fluconazole) .Marland Kitchen.. 1 by mouth for  infection  [facility] [phone]) GC/Chlamydia-[facility] ([id number])  Patient Instructions: 1)  Your next appointment is in 2 weeks 2)  If you do not feel the baby moving then do kick counts 3)  You should feel 5-10 kicks in 1 hour, if not drink a sweet beverage or cold beverage and then wait for movement. 4)  If you have bleeding, your water breaks or severe pain go to women's hospital 5)  Si tiene sangrado vaginal, o si sospecha que se le ha roto la fuente (chorro fuerte o un goteo leve de la vagina) o si tiene contracciones que vienen mas de cada cinco minutos por una horas seguidas, por favor presentese al Narda Rutherford de Emergencia de [facility]. 6)  Cada manana y noche enfoquese en los movimientos del bebe.  Si siente por lo  menos cinco movimientos en una hora, no tiene que seguir contando.  Si no llega a cinco movimientos en una hora, siga contando por otra hora.  Si no llega a diez movimientos en estas dos horas, debe presentarse al [employer] de Emergencia de [facility] o llamar a Event organiser. Prescriptions: FLUCONAZOLE 150 MG TABS (FLUCONAZOLE) 1 by mouth for infection  #1 x 0   Entered and Authorized by:   Milinda Antis MD   Signed by:   Milinda Antis MD on 01/02/2010   Method used:   Electronically to        [facility] [address] (retail)       [phone]  [place], [place]  [address]       Ph: [phone]       Fax: [phone]   RxID:   [id number]    Flowsheet View for Follow-up Visit    Estimated weeks of       gestation:     29 0/7    Weight:     133    Blood pressure:   93 / 60    Hx headache?     No    Nausea/vomiting?   No    Edema?     0    Bleeding?     no    Leakage/discharge?   d/c    Fetal activity:       yes    Labor symptoms?   no    Fundal height:      29    FHR:       140    Fetal position:      ??    Cx dilation:     0    Cx effacement:   0    Fetal station:     high     Taking Vitamins?   Y    Smoking PPD:   n/a    Next visit:     2 wk    Resident:     [facility]    Laboratory Results  Date/Time Received: January 02, 2010 12:19 PM  Date/Time Reported: January 02, 2010 12:22 PM   [employer] Source: vaginal WBC/hpf: >20 Bacteria/hpf: 3+ Clue cells/hpf: none Yeast/hpf: many Trichomonas/hpf: none Comments: ...........test performed by...........Marland KitchenTerese Door, CMA

## 2010-05-19 NOTE — Miscellaneous (Signed)
Summary: re: yeast inf/ts  Clinical Lists Changes Marines called pt and informed pt of yeast infection. per Dr.Corey to use OTC yeast infection kit. Arlyss Repress CMA,  October 17, 2009 1:59 PM

## 2010-05-19 NOTE — Progress Notes (Signed)
Summary: pt medication   Phone Note Call from Patient   Caller: Patient Summary of Call: Pt went to [facility] to pick up medic Fluconazole 150mg  but pharmacy stated they don't have her prescrip.  Initial call taken by: Marines Jean Rosenthal,  January 19, 2010 9:01 AM  Follow-up for Phone Call       Follow-up by: Golden Circle RN,  January 19, 2010 9:33 AM    Prescriptions: FLUCONAZOLE 150 MG TABS (FLUCONAZOLE) 1 by mouth for infection  #1 x 0   Entered by:   Golden Circle RN   Authorized by:   Paula Compton MD   Signed by:   Golden Circle RN on 01/19/2010   Method used:   Electronically to        [facility] [address] (retail)       [phone]       [place], [place]  [address]       Ph: [phone]       Fax: [phone]   RxID:   [id number]

## 2010-05-19 NOTE — Assessment & Plan Note (Signed)
Summary: BP CHECK/KH  Nurse Visit BP checked manually with regular adult cuff. BP LA 136/88, RA 130/86 pulse 80. Spanish intreptor is present.  swelling of hands , feet and face noted. patient states it is about the same as yesterday.  paged Dr. [name] and he spoke with Dr. Mauricio Po over the phone , then Dr. Mauricio Po spoke with patient and advised her to go to MAU now for evaluation. she is to take the urine she collected for 24 hour urine with her instead of running thru lab thru  our office. a letter is given to patient to present to MAU when she arrives . Theresia Lo RN  February 27, 2010 4:15 PM   Allergies: No Known Drug Allergies  Orders Added: 1)  No Charge Patient Arrived (NCPA0) [NCPA0]  Appended Document: BP CHECK/KH Patient seen in triage nurse room; I interviewed patient directly in her native Spanish.  I have reviewed her chart and labs extensively, spoke by phone during our visit with Dr [name], her PCP.  Briefly, she reports taht she has felt decreased fetal movement today.  Has had increasing blood pressures on subsequent checks.  UA done during visit yesterday with protein, casts.  In light of her increasing pressure and decreased fetal movement, proteinuria, and concern for pre-eclampsia, I have advised the patient that she is to go directly to the MAU of [facility].  Dr [name] has agreed to call the MAU to advise of our management decision.  Patient has collected 24hr urine for protein, she brings it in today.  She is asked to bring the urine specimen with her to [facility] for processing there, so that results will be available to the [facility] team.   Clinical Lists Changes  Orders: Added new Test order of Other OB visit- [facility]) - Signed

## 2010-05-19 NOTE — Assessment & Plan Note (Signed)
Summary: ob visit,tcb   Vital Signs:  Patient profile:   30 year old female Weight:      122 pounds BP sitting:   106 / 67  Vitals Entered By: Arlyss Repress CMA, (October 22, 2009 3:33 PM)  Primary Care Provider:  Clementeen Graham MD  CC:  OB Visit.  History of Present Illness: [name] presents to clinic today for a PNV> At the last visit she did not come with a trnaslator and the intake historical information was deferred. Most significantly is a history of 4 SABs with the latest gestation of 16 weeks.  2 of the SABs were incomplete SABs requiring D&C. This is the latest gestation to date. She is taking PNV and feels well. She remains anxious regarding her pregnancy.  See flowsheet.    Also in the interum [name] has treated a yeast infection with an OTC kit. She feels better and denies discharge.  Habits & Providers  Alcohol-Tobacco-Diet     Tobacco Status: never     Cigarette Packs/Day: n/a  Current Problems (verified): 1)  Pregnancy With History of Abortion  (ICD-V23.2)  Current Medications (verified): 1)  Prenatal/folic Acid  Tabs (Prenatal Vit-Fe Fumarate-Fa)  Allergies (verified): No Known Drug Allergies  Past History:  Past Surgical History: Last updated: 10/13/2009 None  Family History: Last updated: 10/13/2009 None  Social History: Last updated: 10/13/2009 Has been in the [place] for a few years. FOB/Partner was deported 3 months ago.   Currently living with her sisters and aunts.  Feels safe at home and well loved.  Works.   Risk Factors: Smoking Status: never (10/22/2009) Packs/Day: n/a (10/22/2009)  Past Medical History: 4 SABs 2 requiring D&C.  Othwerwise normal.   Social History: Hepatitis Risk:  no  Review of Systems       See HPI and flowsheet  Physical Exam  General:  VS noted. Well gravid woman in NAD See flowsheet   Impression & Recommendations:  Problem # 1:  PREGNANCY WITH HISTORY OF ABORTION (ICD-V23.2) Assessment  Unchanged All SABs were less than 20 weeks. Therefore pt is unlikley to be a cantidate to 17P therapy.  However her history is concerning for Lupus Anticoagulant and Anticardiolipin AB.   Currently no criteria for referral to [facility].  Plan Test for  Lupus Anticoagulant and Anticardiolipin AB Obtain Ultrasound at today's visit. Follow up in 4 weeks.  Will referr to [facility] if any indications positive. Red flags given  Orders: Miscellaneous Lab Charge-[facility] ([id number]) Ultrasound (Ultrasound) Other OB visit- [facility] (O[facility]K)  Complete Medication List: 1)  Prenatal/folic Acid Tabs (Prenatal vit-fe fumarate-fa)  Patient Instructions: 1)  Thank you for seeing me today. 2)  Please schedule a follow-up appointment in 1 month.  3)  Get your [place] soon. 4)  Si tiene sangrado vaginal, o si sospecha que se le ha roto la fuente (chorro fuerte o un goteo leve de la vagina) o si tiene contracciones que vienen mas de cada cinco minutos por una horas seguidas, por favor presentese al Narda Rutherford de [facility].   OB Initial Intake Information    Positive HCG by: self    Race: Hispanic    Marital status: Single  FOB Information    Husband/Father of baby: [name]  Menstrual History    LMP (date): 06/13/2009    EDC by LMP: 03/20/2010    Best Working EDC: 03/20/2010    LMP - Character: normal    LMP - Reliable? : Yes  Menarche: 11 years    Menses interval: 30 days    Menstrual flow 3-8 days    On BCP's at conception: no    Date of positive (+) home preg. test: 09/17/2009    Symptoms since LMP: amenorrhea, fatigue, irritability, tender breasts   Flowsheet View for Follow-up Visit    Estimated weeks of       gestation:     18 5/7    Weight:     122    Blood pressure:   106 / 67    Headache:     No    Nausea/vomiting:   No    Edema:     0    Vaginal bleeding:   no    Vaginal discharge:   no    Fundal height:      18    FHR:       140    Fetal activity:     yes     Labor symptoms:   no    Fetal position:     N/A    Taking prenatal vits?   Y    Smoking:     n/a    Next visit:     4 weeks    Resident:     [name]    Preceptor:     McDiarmid   Past Pregnancy History    Gravida:     5    Living Children:   0    Aborta:     4    Spont. Ab:     4    Ectopics:     0  Pregnancy # 1    Delivery date:     10/18/1995    Weeks Gestation:   8    Comments:     [place]  Pregnancy # 2    Delivery date:     10/17/2001    Tania Ade Gestation:   12  Pregnancy # 3    Delivery date:     10/18/2003    Tania Ade Gestation:   17    Comments:     Required D&C  Pregnancy # 4    Delivery date:     05/21/2007    Tania Ade Gestation:   8    Comments:     D&C retained POC  Pregnancy # 5    Comments:     Current   Genetic History     Thalassemia:     mother: no    Neural tube defect:   mother: yes       comments: 1st cousin with NTC died 19yo    Down's Syndrome:   mother: no    Tay-Sachs:     mother: no    Sickle Cell Dz/Trait:   mother: no    Hemophilia:     mother: no    Muscular Dystrophy:   mother: no    Cystic Fibrosis:   mother: no    Huntington's Dz:   mother: no    Mental Retardation:   mother: yes       comments: same 1st cousin with NTD    Fragile X:     mother: no    Other Genetic or       Chromosomal Dz:   mother: no    Child with other       birth defect:     mother: no    > 3 spont. 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  Will need to follow up on HSV and parvovirus exposures.

## 2010-05-19 NOTE — Assessment & Plan Note (Signed)
Summary: ob visit/eo   Vital Signs:  Patient profile:   30 year old female Weight:      152 pounds Pulse rate:   75 / minute BP sitting:   123 / 76  (left arm) Cuff size:   regular  Vitals Entered By: Tessie Fass CMA (February 26, 2010 2:29 PM) CC: OB Visit   Primary Care Provider:  Clementeen Graham MD  CC:  OB Visit.  History of Present Illness: [name] presents to clinic today for a Ob visit.  She is 37 weeks today.  In the interum she notes swelling of her feet and lower legs BL and her BL hands and face. She denies any HA, vision changes, dyspnea or URQ pain. She feels the baby move and does not have any vaginal bleeding.  She is taking her valtrex as directed as well as her PNV.   Habits & Providers  Alcohol-Tobacco-Diet     Alcohol drinks/day: 0     Tobacco Status: never     Cigarette Packs/Day: n/a  Current Problems (verified): 1)  Proteinuria  (ICD-791.0) 2)  Abnormal Maternal Glucose Tolerance Antepartum  (IOE-703.50) 3)  Genital Herpes, Hx of  (ICD-V13.8) 4)  Pregnancy With History of Abortion  (ICD-V23.2)  Current Medications (verified): 1)  Prenatal/folic Acid  Tabs (Prenatal Vit-Fe Fumarate-Fa) 2)  Acyclovir 400 Mg Tabs (Acyclovir) .... Sig: Take 1 Tab By Mouth Three Times Daily Until Delivery Spanish Lang Instructions 3)  Clotrimazole 1 % Crea (Clotrimazole) .... Apply To Affected Skin Twice Daily For 7 Days Disp 1 Large Tube Spanish Language Instructions  Allergies (verified): No Known Drug Allergies  Past History:  Past Medical History: Last updated: 11/26/2009 4 SABs 2 requiring D&C.  Genital hepes last outbreak 6 years ago. "were removed with lazer" question Condoloyma? Othwerwise normal.   Social History: Last updated: 11/26/2009 Has been in the [place] for a few years. FOB/Partner was deported few months ago.   Currently living with her sisters and aunts.  Feels safe at home and well loved.  Works.   Social History: Reviewed history from  11/26/2009 and no changes required. Has been in the [place] for a few years. FOB/Partner was deported few months ago.   Currently living with her sisters and aunts.  Feels safe at home and well loved.  Works.   Review of Systems       See HPI and flowsheet.  Physical Exam  General:  VS noted.  Well NAD Eyes:  No corneal or conjunctival inflammation noted. EOMI. Perrla. Funduscopic exam benign, without hemorrhages, exudates or papilledema. Vision grossly normal. Lungs:  CTABL Heart:  RRR no MRG Abdomen:  37 weeks. Feels vertex Extremities:  Edema in BL 2+ to mid shins.  Hands have mild non-pitting edema.  Neurologic:  Reflex are 2+ BL Patella. No clonus   Impression & Recommendations:  Problem # 1:  PREGNANCY WITH HISTORY OF ABORTION (ICD-V23.2) Assessment Unchanged At 37 weeks.  I am concerned for pre-eclampsia at this point. However patient does not have elevated blood pressure and is asymptomatic. PE does not show hyperreflexia.   I feel is is safe for [name] to return to home tonight and return to clinic tomorrow for BP check and again on Monday for a physician visit.  Will also obtain PIH labs (CMP, CBC, and 24 hour urine collection and urine culture).  Discussed at length with a translator preeclampsia and labor precautions. [name] is able to repeat back to watch out for headache,  vision changes, dyspnea, URQ pain, contractions and bleeding. She knows to call the office or go to the hospital for these things.  I discussed this case with Dr. Cathey Endow, Dr. [place] and Dr. Alben Deeds [facility]) at [facility] who all agree with the above plan.  Low threshold for induction at this time.   Orders: Urinalysis-[facility] (00000) Other OB visit- [facility] (OBCK)  Complete Medication List: 1)  Prenatal/folic Acid Tabs (Prenatal vit-fe fumarate-fa) 2)  Acyclovir 400 Mg Tabs (Acyclovir) .... Sig: take 1 tab by mouth three times daily until delivery spanish lang instructions 3)  Clotrimazole 1 %  Crea (Clotrimazole) .... Apply to affected skin twice daily for 7 days disp 1 large tube spanish language instructions  Other Orders: Urine Culture-[facility] ([id number]) Comp Met-[facility] ([id number]) CBC-[facility] ([id number]) Future Orders: 24hr. Urine TP- [facility] [id number]) ... 02/27/2011   Orders Added: 1)  Urinalysis-[facility] [00000] 2)  Urine Culture-[facility] [[id number]] 3)  Comp Met-[facility] [[id number]] 4)  CBC-[facility] [85027] 5)  24hr. Urine TP- [facility] [[id number]] 6)  Other OB visit- [facility] [OBCK]     OB Initial Intake Information    Positive HCG by: self    Race: Hispanic    Marital status: Single  FOB Information    Husband/Father of baby: [name]  Menstrual History    LMP (date): 06/13/2009    LMP - Character: normal    Menarche: 11 years    Menses interval: 30 days    Menstrual flow 3-8 days    On BCP's at conception: no    Date of positive (+) home preg. test: 09/17/2009   Flowsheet View for Follow-up Visit    Estimated weeks of       gestation:     36 6/7    Weight:     152    Blood pressure:   123 / 76    Urine Protein:     >=300    Urine Glucose:   negative    Urine Nitrite:     negative    Smoking:     n/a   Laboratory Results   Urine Tests  Date/Time Received: February 26, 2010 2:53  PM  Date/Time Reported: February 26, 2010 3:19 PM   Routine Urinalysis   Color: yellow Appearance: Clear Glucose: negative   (Normal Range: Negative) Bilirubin: negative   (Normal Range: Negative) Ketone: negative   (Normal Range: Negative) Spec. Gravity: 1.025   (Normal Range: 1.003-1.035) Blood: moderate   (Normal Range: Negative) pH: 6.5   (Normal Range: 5.0-8.0) Protein: >=300   (Normal Range: Negative) Urobilinogen: 0.2   (Normal Range: 0-1) Nitrite: negative   (Normal Range: Negative) Leukocyte Esterace: trace   (Normal Range: Negative)  Urine Microscopic WBC/HPF: 1-5 RBC/HPF: 1-5 Bacteria/HPF: 2+ Epithelial/HPF: 5-10 Casts/LPF: 10-20 granular; occ hyaline      Comments: urine sent for culture ...........test performed by...........Marland KitchenTerese Door, CMA

## 2010-05-19 NOTE — Assessment & Plan Note (Signed)
Summary: breast engorged, Dr. Kandee Keen to see/ls   Vital Signs:  Patient profile:   30 year old female Weight:      144.5 pounds Temp:     98.4 degrees F Pulse rate:   73 / minute BP sitting:   141 / 94  (left arm)  Vitals Entered By: Theresia Lo RN (March 06, 2010 2:33 PM) CC: breast engorged Is Patient Diabetic? No Pain Assessment Patient in pain? yes     Location: breast  Intensity: 4 Type: sore   Primary Care Provider:  Clementeen Graham MD  CC:  breast engorged.  History of Present Illness: [name] presents to clinic today after discarge from [facility] on [facility]/05/10 with her infant.  She is seen today because her breasts are engoraged and tender.  She has been mostly bottle feeding her infant. She is occasionally breast feeding around 15 mins per side. She notes that her breasts today became firm, warm and mildly tender. She denies any fever or chills or erythemia on her breasts.  She feels well otherwise.   Current Problems (verified): 1)  Breast Engorgement  (ICD-611.9) 2)  Pre-eclampsia  (ICD-642.40) 3)  Proteinuria  (ICD-791.0) 4)  Abnormal Maternal Glucose Tolerance Antepartum  (HKV-425.95) 5)  Genital Herpes, Hx of  (ICD-V13.8) 6)  Pregnancy With History of Abortion  (ICD-V23.2)  Current Medications (verified): 1)  Prenatal/folic Acid  Tabs (Prenatal Vit-Fe Fumarate-Fa) 2)  Acyclovir 400 Mg Tabs (Acyclovir) .... Sig: Take 1 Tab By Mouth Three Times Daily Until Delivery Spanish Lang Instructions 3)  Clotrimazole 1 % Crea (Clotrimazole) .... Apply To Affected Skin Twice Daily For 7 Days Disp 1 Large Tube Spanish Language Instructions  Allergies (verified): No Known Drug Allergies  Past History:  Past Medical History: Last updated: 11/26/2009 4 SABs 2 requiring D&C.  Genital hepes last outbreak 6 years ago. "were removed with lazer" question Condoloyma? Othwerwise normal.   Past Surgical History: Last updated: 10/13/2009 None  Social History: Last updated:  11/26/2009 Has been in the [place] for a few years. FOB/Partner was deported few months ago.   Currently living with her sisters and aunts.  Feels safe at home and well loved.  Works.   Risk Factors: Smoking Status: never (02/26/2010) Packs/Day: n/a (02/26/2010)  Review of Systems  The patient denies anorexia, fever, weight loss, chest pain, abdominal pain, severe indigestion/heartburn, difficulty walking, and depression.    Physical Exam  General:  VS noted.  Well NAD Breasts:  Firm, enlarged, warm and mildly tender. No focal areas of erytemia or tenderness.  Lungs:  Normal respiratory effort, chest expands symmetrically. Lungs are clear to auscultation, no crackles or wheezes. Heart:  Normal rate and regular rhythm. S1 and S2 normal without gallop, murmur, click, rub or other extra sounds. Abdomen:  Incision is CDI and fundus is firm below the umbilicus.  Extremities:  4+ edema Bl LE to shins   Impression & Recommendations:  Problem # 1:  BREAST ENGORGEMENT (ICD-611.9) Assessment New  Due to milk production.  Encouraged breastfeeding or manual expression of the breast milk. Advised tylenol and ice as needed.  Also warned about the signs and symptoms of mastitis. Used Nurse, learning disability. Pt expressed understanding.   Orders: No Charge Patient Arrived (NCPA0) (NCPA0)  Complete Medication List: 1)  Prenatal/folic Acid Tabs (Prenatal vit-fe fumarate-fa) 2)  Acyclovir 400 Mg Tabs (Acyclovir) .... Sig: take 1 tab by mouth three times daily until delivery spanish lang instructions 3)  Clotrimazole 1 % Crea (Clotrimazole) .... Apply to affected  skin twice daily for 7 days disp 1 large tube spanish language instructions   Orders Added: 1)  No Charge Patient Arrived (NCPA0) [NCPA0]  Appended Document: breast engorged, Dr. Kandee Keen to see/ls    Clinical Lists Changes  Problems: Removed problem of PROTEINURIA (ICD-791.0) Removed problem of ABNORMAL MATERNAL GLUCOSE TOLERANCE ANTEPARTUM  (KVQ-259.56) Removed problem of PRE-ECLAMPSIA (ICD-642.40) Removed problem of PREGNANCY WITH HISTORY OF ABORTION (ICD-V23.2) Orders: Added new Test order of [facility]- Est Level  2 (38756) - Signed

## 2010-05-19 NOTE — Progress Notes (Signed)
  Phone Note Outgoing Call   Call placed by: Clementeen Graham MD,  October 27, 2009 8:26 AM Summary of Call: Simona Huh Rascoe and scheduled a high risk OB attp on 7/21 at 830am at [facility] clinic.   Indication due to 4 SABs <20 weeks. Will ask Mirenas to contact the pt and inform her in [place].      Appended Document:  Marines called the pt and informed pt of appt at [facility]

## 2010-05-19 NOTE — Progress Notes (Signed)
Summary: [name]   Phone Note Outgoing Call   Summary of Call: I called pt and let her knows about her labworks result and medic instruction. She was happy with the labwork result and she did appt to see Dr. Mauricio Po on 01/16/10@10 :15 Initial call taken by: Marines Jean Rosenthal,  January 06, 2010 10:40 AM

## 2010-05-19 NOTE — Assessment & Plan Note (Signed)
Summary: OB VISIT/MJ   Vital Signs:  Patient profile:   30 year old female Weight:      139.2 pounds BP sitting:   106 / 64  Primary Care Provider:  Clementeen Graham MD   History of Present Illness: OB visit at 32.5. Doing well.  Notes bilateral Metacarpal phalangeal joint pain and swelling for 2 weeks. Hurts at night especially.  Is painful to make a fist. No fever chills, palmar itching. No other problems. Has not tried any pain medications yet.  See flowsheet.   Habits & Providers  Alcohol-Tobacco-Diet     Alcohol drinks/day: 0     Tobacco Status: never     Cigarette Packs/Day: n/a  Current Problems (verified): 1)  Hand Pain, Bilateral  (ICD-729.5) 2)  Leukorrhea  (ICD-623.5) 3)  Supervision of Other Normal Pregnancy  (ICD-V22.1) 4)  Abnormal Maternal Glucose Tolerance Antepartum  (AVW-098.11) 5)  Genital Herpes, Hx of  (ICD-V13.8) 6)  Pregnancy With History of Abortion  (ICD-V23.2)  Current Medications (verified): 1)  Prenatal/folic Acid  Tabs (Prenatal Vit-Fe Fumarate-Fa)  Allergies (verified): No Known Drug Allergies  Past History:  Past Medical History: Last updated: 11/26/2009 4 SABs 2 requiring D&C.  Genital hepes last outbreak 6 years ago. "were removed with lazer" question Condoloyma? Othwerwise normal.   Past Surgical History: Last updated: 10/13/2009 None  Social History: Last updated: 11/26/2009 Has been in the [place] for a few years. FOB/Partner was deported few months ago.   Currently living with her sisters and aunts.  Feels safe at home and well loved.  Works.   Risk Factors: Smoking Status: never (01/28/2010) Packs/Day: n/a (01/28/2010)  Social History: Reviewed history from 11/26/2009 and no changes required. Has been in the [place] for a few years. FOB/Partner was deported few months ago.   Currently living with her sisters and aunts.  Feels safe at home and well loved.  Works.   Review of Systems  The patient denies anorexia, fever, weight  loss, chest pain, syncope, dyspnea on exertion, and abdominal pain.    Physical Exam  General:  VS noted.  Well NAD Lungs:  Normal respiratory effort, chest expands symmetrically. Lungs are clear to auscultation, no crackles or wheezes. Heart:  Normal rate and regular rhythm. S1 and S2 normal without gallop, murmur, click, rub or other extra sounds. Abdomen:  Gravid to 32 cm. Normal otherwise Msk:  Hands: Bilateral MCP mild synovisitis/swelling. Not terribly tender to palpation. Some pain when making a fist.  Hands WNL otherwise.  Extremities:  Non edemetus BL LE   Impression & Recommendations:  Problem # 1:  SUPERVISION OF OTHER NORMAL PREGNANCY (ICD-V22.1) Assessment Unchanged  Doing well at 32.5 weeks. Plan to follow up in 2 weeks.  Will start valtrex then.  GBS at 36 weeks.  Labor and kickount reviewed.   Orders: Other OB visit- [facility] ([facility]K)  Problem # 2:  HAND PAIN, BILATERAL (ICD-729.5) Assessment: New Unknown Dx at this time. Partially consistant with RA. However this is rare to have 1st flair during pregnancy. Will treat pain with tylenol initially and then opiates if not effective.  Will follow.  Further workup following delivery if persistant.   Complete Medication List: 1)  Prenatal/folic Acid Tabs (Prenatal vit-fe fumarate-fa)  Patient Instructions: 1)  Thank you for seeing me today. 2)  Si tiene sangrado vaginal, o si sospecha que se le ha roto la fuente (chorro fuerte o un goteo leve de la vagina) o si tiene contracciones que vienen mas de  cada cinco minutos por dos horas seguidas, por favor presentese al Narda Rutherford de [facility]. 3)  Cada manana y noche enfoquese en los movimientos del bebe.  Si siente por lo  menos cinco movimientos en una hora, no tiene que seguir contando.  Si no llega a cinco movimientos en una hora, siga contando por otra hora.  Si no llega a diez movimientos en estas dos horas, debe presentarse al [employer] de  [facility] o llamar a Event organiser. 4)  Please schedule a follow-up appointment in 2 weeks.  5)  Tylenol for pain 2 pills every 6 hours as needed.    OB Initial Intake Information    Positive HCG by: self    Race: Hispanic    Marital status: Single  FOB Information    Husband/Father of baby: [name]  Menstrual History    LMP (date): 06/13/2009    LMP - Character: normal    Menarche: 11 years    Menses interval: 30 days    Menstrual flow 3-8 days    On BCP's at conception: no    Date of positive (+) home preg. test: 09/17/2009   Flowsheet View for Follow-up Visit    Estimated weeks of       gestation:     32 5/7    Weight:     139.2    Blood pressure:   106 / 64    Headache:     No    Nausea/vomiting:   No    Edema:     0    Vaginal bleeding:   no    Vaginal discharge:   d/c    Fundal height:      32    FHR:       140s    Fetal activity:     yes    Labor symptoms:   no    Fetal position:     vertex    Taking prenatal vits?   Y    Smoking:     n/a    Next visit:     2 wk    Resident:     [name]    Preceptor:     [place]

## 2010-05-21 NOTE — Assessment & Plan Note (Signed)
Summary: 6 week post partum/eo   Vital Signs:  Patient profile:   30 year old female Weight:      126 pounds Pulse rate:   76 / minute BP sitting:   104 / 69  (left arm) Cuff size:   regular  Vitals Entered By: Tessie Fass CMA (April 15, 2010 9:06 AM) CC: postpartum check Pain Assessment Patient in pain? no        Primary Care Provider:  Clementeen Graham MD  CC:  postpartum check.  History of Present Illness: Doing well 6 weeks post partum.  Noted 15 days ago that her C-section scar dehised a small amount along the midline. Non painful, no discharge, and no redness noted. Feels well otherwise. Her husband is in [place] so she is not having sex and is not taking the combined OCPs that were provided at discharge.  Asks about the risk of pre-x with future prgnancies.   Habits & Providers  Alcohol-Tobacco-Diet     Alcohol drinks/day: 0     Tobacco Status: never     Cigarette Packs/Day: n/a  Current Problems (verified): 1)  Pregnancy  (ICD-V22.2) 2)  Genital Herpes, Hx of  (ICD-V13.8)  Current Medications (verified): 1)  Prenatal/folic Acid  Tabs (Prenatal Vit-Fe Fumarate-Fa)  Allergies (verified): No Known Drug Allergies  Past History:  Past Medical History: Last updated: 03/11/2010 Preeclampsia with induction at 37 weeks in 02/2010 4 SABs 2 requiring D&C.  Genital hepes last outbreak 6 years ago. "were removed with lazer" question Condoloyma?  Past Surgical History: Last updated: 03/11/2010 Primary LTCS 02/2010  Family History: Last updated: 10/13/2009 None  Social History: Last updated: 11/26/2009 Has been in the [place] for a few years. FOB/Partner was deported few months ago.   Currently living with her sisters and aunts.  Feels safe at home and well loved.  Works.   Risk Factors: Smoking Status: never (04/15/2010) Packs/Day: n/a (04/15/2010)  Review of Systems  The patient denies anorexia, fever, and weight loss.    Physical Exam  General:  VS  noted.  Well NAD Mouth:  Oral mucosa and oropharynx without lesions or exudates.  Teeth in good repair. Lungs:  Normal respiratory effort, chest expands symmetrically. Lungs are clear to auscultation, no crackles or wheezes. Heart:  Normal rate and regular rhythm. S1 and S2 normal without gallop, murmur, click, rub or other extra sounds. Abdomen:  NABS, NT, ND,  Incision is healing well with a small 3mm dehisence along the midline. No discharge or surrounding erythemia. Non tender. No masses palpated.  Fundus is below the pelvic brim.  Genitalia:  Deferred due to CS Extremities:  Non edemetus BL LE   Impression & Recommendations:  Problem # 1:  PREGNANCY (ICD-V22.2)  Post partum now. Feels well.  1) Discussed birth control and the improtance of contraception to avoid pregnancy. Not all sexual encounters are planned.  2) Discussed risks of Pre-x with future pregnancy. If pregnant with same man pre-x may come sooner in the pregnancy and be worse.  3) CS incision scar looks well and will mature nicely.   Advised follow up in 1 year or sooner if needed.   [id number]  Complete Medication List: 1)  Prenatal/folic Acid Tabs (Prenatal vit-fe fumarate-fa)  Patient Instructions: 1)  Thank you for seeing me today. 2)  Please come back in 1 year for a well woman exam or if you ger pregnant again.  3)  Enjoy you baby.   Orders Added: 1)  Postpartum visit- [facility] [[id number]]

## 2010-06-30 LAB — COMPREHENSIVE METABOLIC PANEL
ALT: 17 U/L (ref 0–35)
Albumin: 1.7 g/dL — ABNORMAL LOW (ref 3.5–5.2)
Alkaline Phosphatase: 131 U/L — ABNORMAL HIGH (ref 39–117)
Alkaline Phosphatase: 228 U/L — ABNORMAL HIGH (ref 39–117)
BUN: 1 mg/dL — ABNORMAL LOW (ref 6–23)
BUN: 5 mg/dL — ABNORMAL LOW (ref 6–23)
CO2: 20 mEq/L (ref 19–32)
Calcium: 6.5 mg/dL — ABNORMAL LOW (ref 8.4–10.5)
Chloride: 107 mEq/L (ref 96–112)
GFR calc non Af Amer: >60 mL/min (ref >60)
Glucose, Bld: 77 mg/dL (ref 70–99)
Potassium: 3.9 mEq/L (ref 3.5–5.1)
Potassium: 3.9 mEq/L (ref 3.5–5.1)
Sodium: 136 mEq/L (ref 135–145)
Total Bilirubin: 0.2 mg/dL — ABNORMAL LOW (ref 0.3–1.2)
Total Protein: 4.4 g/dL — ABNORMAL LOW (ref 6.0–8.3)
Total Protein: 5.5 g/dL — ABNORMAL LOW (ref 6.0–8.3)

## 2010-06-30 LAB — DIC (DISSEMINATED INTRAVASCULAR COAGULATION)PANEL
D-Dimer, Quant: 3.24 ug/mL-FEU — ABNORMAL HIGH (ref 0.00–0.48)
Smear Review: NONE SEEN
aPTT: 32 seconds (ref 24–37)

## 2010-06-30 LAB — CBC
HCT: 42.8 % (ref 36.0–46.0)
Hemoglobin: 13.1 g/dL (ref 12.0–15.0)
Hemoglobin: 14.8 g/dL (ref 12.0–15.0)
MCH: 32.7 pg (ref 26.0–34.0)
MCHC: 33.6 g/dL (ref 30.0–36.0)
MCHC: 34.7 g/dL (ref 30.0–36.0)
MCHC: 34.8 g/dL (ref 30.0–36.0)
MCV: 94 fL (ref 78.0–100.0)
MCV: 94.2 fL (ref 78.0–100.0)
MCV: 95.2 fL (ref 78.0–100.0)
MCV: 96.6 fL (ref 78.0–100.0)
Platelets: 106 10*3/uL — ABNORMAL LOW (ref 150–400)
Platelets: 115 10*3/uL — ABNORMAL LOW (ref 150–400)
Platelets: 136 10*3/uL — ABNORMAL LOW (ref 150–400)
Platelets: 137 10*3/uL — ABNORMAL LOW (ref 150–400)
RBC: 4.02 MIL/uL (ref 3.87–5.11)
RBC: 4.54 MIL/uL (ref 3.87–5.11)
RDW: 13.7 % (ref 11.5–15.5)
RDW: 13.8 % (ref 11.5–15.5)
RDW: 14.1 % (ref 11.5–15.5)
WBC: 12.5 10*3/uL — ABNORMAL HIGH (ref 4.0–10.5)
WBC: 12.9 10*3/uL — ABNORMAL HIGH (ref 4.0–10.5)
WBC: 16.1 10*3/uL — ABNORMAL HIGH (ref 4.0–10.5)
WBC: 17.3 10*3/uL — ABNORMAL HIGH (ref 4.0–10.5)

## 2010-06-30 LAB — TYPE AND SCREEN
ABO/RH(D): O POS
Unit division: 0

## 2010-06-30 LAB — CREATININE, URINE, 24 HOUR
Collection Interval-UCRE24: 24 hours
Creatinine, Urine: 50.3 mg/dL
Urine Total Volume-UCRE24: 2040 mL

## 2010-06-30 LAB — RPR: RPR Ser Ql: NONREACTIVE

## 2010-06-30 LAB — BASIC METABOLIC PANEL
BUN: 6 mg/dL (ref 6–23)
Creatinine, Ser: 0.89 mg/dL (ref 0.4–1.2)
GFR calc non Af Amer: >60 mL/min (ref >60)

## 2010-06-30 LAB — PROTIME-INR: INR: 0.89 (ref 0.00–1.49)

## 2010-06-30 LAB — PREPARE PLATELETS

## 2010-06-30 LAB — PROTEIN / CREATININE RATIO, URINE
Creatinine, Urine: 195.34 mg/dL
Total Protein, Urine: 683 mg/dL

## 2010-06-30 LAB — MAGNESIUM
Magnesium: 6.6 mg/dL (ref 1.5–2.5)
Magnesium: 7.8 mg/dL (ref 1.5–2.5)

## 2010-06-30 LAB — MRSA PCR SCREENING: MRSA by PCR: NEGATIVE

## 2010-06-30 LAB — APTT: aPTT: 32 seconds (ref 24–37)

## 2010-06-30 LAB — CREATININE, SERUM: GFR calc non Af Amer: >60 mL/min (ref >60)

## 2010-07-03 LAB — GLUCOSE, CAPILLARY: Glucose-Capillary: 139 mg/dL — ABNORMAL HIGH (ref 70–99)

## 2010-07-04 LAB — POCT URINALYSIS DIP (DEVICE)
Bilirubin Urine: NEGATIVE
Glucose, UA: NEGATIVE mg/dL
Ketones, ur: NEGATIVE mg/dL
Protein, ur: NEGATIVE mg/dL
Specific Gravity, Urine: 1.025 (ref 1.005–1.030)

## 2010-09-01 NOTE — Group Therapy Note (Signed)
NAMEMarland Kitchen  [name], [name] NO.:  [id number]   MEDICAL RECORD NO.:  [id number]          PATIENT TYPE:  WOC   LOCATION:  [facility]                   FACILITY:  [facility]   PHYSICIAN:  Tinnie Gens, MD        DATE OF BIRTH:  1980-07-01   DATE OF SERVICE:  08/10/2007                                  CLINIC NOTE   CHIEF COMPLAINT:  Follow-up miscarriage.   HISTORY OF PRESENT ILLNESS:  The patient is a 30 year old gravida 4,  para 0-0-4-0 who has a history of recurrent AB who had her last loss  with a an intrauterine gestational sac that never formed an embryo and  was here.  Prior to that, she had all miscarriages in [place].  The  patient had moderate fever and had retained products and so was taken to  the OR for a D&C which was successfully done.  Since then, she has had  no significant bleeding or fever.  The patient has never had a workup  for recurrent AB.  She does report weight loss without trying.   PAST MEDICAL HISTORY:  Significant for ulcer.   PAST SURGICAL HISTORY:  D&C times four.   MEDICATIONS:  None.   ALLERGIES:  None known.  Not allergic to latex.   OBSTETRICAL HISTORY:  She is a G4, P 0, four miscarriages, four D&Cs.   GYN HISTORY:  Menstrual history:  Menarche at age 42.  Cycles are  regular, every 25 days, last 3-5 days.  She currently uses condoms and  has used the pill in the past for birth control.  She has a history of  abnormal Pap smear.   SOCIAL HISTORY:  She lives with her sister and two nephews.  She does  smoke approximately two cigarettes per week.  She used cocaine once 15  years ago.  She does drink caffeinated beverages.   REVIEW OF SYSTEMS:  A 14-poing review of system reviewed.  Please see  GYN history on the chart.  It is positive only for weight loss, as  mentioned in the HPI.   PHYSICAL EXAMINATION:  VITAL SIGNS:  Vitals are as in the chart.  GENERAL:  She is a well-nourished female in no acute distress.  ABDOMEN:  Soft,  nontender, nondistended.  GU:  Normal external female genitalia, BUS normal.  Vagina is pink and  rugated.  Cervix is parous without lesion.  Uterus is small, anteverted.  No adnexal mass or tenderness.   IMPRESSION:  1. Status post D&C.  2. Habitual AB.  3. Desire for contraception.   PLAN:  1. Check anticardiolipin,  lupus anticoagulant today.  Rule out      diabetes today.  Check thyroid today.  2. Ovcon one p.o. daily for the next 3 months, as there is increased      risk of congenital anomaly if she gets pregnant within three months      of miscarriage.  This was verbalized to the patient.  3. The patient will follow up for test results in several weeks.  The      patient can return to normal activity.  ______________________________  Tinnie Gens, MD     TP/MEDQ  D:  08/10/2007  T:  08/10/2007  Job:  [id number]

## 2010-09-01 NOTE — Op Note (Signed)
[name], BERTHOLF         ACCOUNT NO.:  [id number]   MEDICAL RECORD NO.:  [id number]          PATIENT TYPE:  AMB   LOCATION:  [facility]                          [facility]   PHYSICIAN:  Norton Blizzard, MD    DATE OF BIRTH:  05-Jul-1980   DATE OF PROCEDURE:  07/20/2007  DATE OF DISCHARGE:                               OPERATIVE REPORT   PREOPERATIVE DIAGNOSIS:  Incomplete miscarriage versus retained products  of conception.   POSTOPERATIVE DIAGNOSIS:  Incomplete miscarriage versus retained  products of conception.   PROCEDURE:  Suction dilation and curettage.   SURGEON:  Norton Blizzard, M.D.   ANESTHESIA:  Monitored anesthesia and paracervical block   INDICATIONS:  The patient is a 30 year old, G4, P0-0-4-0, status post  her third miscarriage who came to the [facility] for follow up. Earlier this  pregnancy, the patient presented on the 16th for evaluation.  Her  ultrasound, at that point, showed an intrauterine gestational sac and  beta HCG of 122.  She has been followed every week and her beta's have  plateaued in the 50s.  The patient also reported having fevers and  chills at home.  Her admission temperature was 100.5. A repeat  ultrasound was done which showed no intrauterine gestational sac but  heterogeneous material within the uterus with flow concerning for  retained products of conception.  The patient also had a functional cyst  of the left ovary.  No free fluid was noted.  Given her retained POC and  fever, I recommended a D&C.  The risks were explained to the patient and  informed consent was obtained.   FINDINGS:  Small anteverted uterus, moderate amount of products of  conception.   ESTIMATED BLOOD LOSS:  200 mL.   DISPOSITION OF SPECIMENS:  Placenta.   PROCEDURE IN DETAIL:  The patient was taken to the operating room where  monitored anesthesia was placed and found to be adequate.  She was then  placed in a dorsal lithotomy position and prepped and draped  in a  sterile manner.  A speculum was placed in the patient's vagina and the  anterior lip of the cervix was grasped with a tenaculum.  The cervix was  dilated up to accommodate a 7 mm suction curette.  The curette was  gently advanced into the fundus and was slowly rotated to clear the  uterus of all contents.  A moderate amount of products of conception was  noted including 5 cm x 3 cm fragment of possible gestational sac which  was removed intact. After the evacuation, a sharp curettage was done to  perform evacuation of the uterus.  The patient was having a moderate  amount of bleeding after the procedure and received one dose of  Methergine IM which helped slow down the bleeding.  The patient  tolerated the procedure well.  Sponge and instrument counts were correct  x2.  She was taken to the recovery room in stable condition.      Norton Blizzard, MD  Electronically Signed     UAD/MEDQ  D:  07/20/2007  T:  07/20/2007  Job:  [id number] 

## 2010-09-02 ENCOUNTER — Ambulatory Visit (INDEPENDENT_AMBULATORY_CARE_PROVIDER_SITE_OTHER): Payer: Self-pay | Admitting: Family Medicine

## 2010-09-02 VITALS — BP 104/68 | Temp 98.7°F | Ht 61.0 in | Wt 125.0 lb

## 2010-09-02 DIAGNOSIS — R5383 Other fatigue: Secondary | ICD-10-CM | POA: Insufficient documentation

## 2010-09-02 DIAGNOSIS — F321 Major depressive disorder, single episode, moderate: Secondary | ICD-10-CM

## 2010-09-02 DIAGNOSIS — R5381 Other malaise: Secondary | ICD-10-CM

## 2010-09-02 LAB — T3, FREE: T3, Free: 2.2 pg/mL — ABNORMAL LOW (ref 2.3–4.2)

## 2010-09-02 LAB — TSH: TSH: 112.111 u[IU]/mL — ABNORMAL HIGH (ref 0.350–4.500)

## 2010-09-02 LAB — T4, FREE: Free T4: 0.44 ng/dL — ABNORMAL LOW (ref 0.80–1.80)

## 2010-09-02 MED ORDER — SERTRALINE HCL 25 MG PO TABS: 25.0000 mg | ORAL_TABLET | Freq: Every day | ORAL | Status: DC

## 2010-09-02 NOTE — Patient Instructions (Signed)
Thank you for coming in today. Come back in 2 weeks.  Take the medicine at night and come back.

## 2010-09-03 ENCOUNTER — Encounter: Payer: Self-pay | Admitting: Family Medicine

## 2010-09-03 ENCOUNTER — Other Ambulatory Visit: Payer: Self-pay | Admitting: Family Medicine

## 2010-09-03 MED ORDER — LEVOTHYROXINE SODIUM 88 MCG PO TABS: 88.0000 ug | ORAL_TABLET | Freq: Every day | ORAL | Status: DC

## 2010-09-03 NOTE — Progress Notes (Signed)
Fatigue x 2 months. Feels tired and down. Notes feeling hot a lot and neck swelling. PHQ9 today is 18. No SI/HI. Feels attachment to her baby. No fevers or chills.   PMH reviewed.  ROS as above otherwise neg  Exam:  Vs noted.  Gen: Well NAD HEENT: BL smooth thryomegaly. Non tender.  Lungs: CTABL Nl WOB Heart: RRR no MRG Abd: NABS, NT, ND Exts: Non edematous BL  LE Psych: AOx3, good eye contact, no delusions, no hallucinations,  No SI/HI

## 2010-09-03 NOTE — Assessment & Plan Note (Signed)
Think depression based on symptoms and PHQ-9  Of 18. No SI/HI.  Starting Zoloft 25 as this is safer in breast milk. Will f/u in 2 weeks.  Gave info on resources in the area.

## 2010-09-03 NOTE — Assessment & Plan Note (Signed)
Suspect depression, vs thyroid disease vs both.  Plan: Treat depression as above and asses thryoid with TSH, T4 and T3.  Will follow in 2 weeks.

## 2010-09-16 ENCOUNTER — Ambulatory Visit (INDEPENDENT_AMBULATORY_CARE_PROVIDER_SITE_OTHER): Payer: Self-pay | Admitting: Family Medicine

## 2010-09-16 ENCOUNTER — Encounter: Payer: Self-pay | Admitting: Family Medicine

## 2010-09-16 VITALS — BP 97/63 | HR 79 | Temp 98.3°F | Wt 122.7 lb

## 2010-09-16 DIAGNOSIS — F321 Major depressive disorder, single episode, moderate: Secondary | ICD-10-CM

## 2010-09-16 DIAGNOSIS — N912 Amenorrhea, unspecified: Secondary | ICD-10-CM | POA: Insufficient documentation

## 2010-09-16 DIAGNOSIS — E039 Hypothyroidism, unspecified: Secondary | ICD-10-CM | POA: Insufficient documentation

## 2010-09-16 NOTE — Assessment & Plan Note (Signed)
Not sure if depression is true or just symptoms of hypothyroid. However will continue zoloft and titrate as needed.  No SI/HI. Will follow.

## 2010-09-16 NOTE — Progress Notes (Signed)
Feeling OK today.  Mood is improved and is feeling a little less tired. Taking both zoloft and levothyroxine. No SI/HI Breast and bottle feeding.  Notes that she has not had a period in 6 months.   PMH reviewed.  ROS as above otherwise neg  Exam:  Vs noted.  Gen: Well NAD HEENT: Large smooth symmetric goiter.  Lungs: CTABL Nl WOB Heart: RRR no MRG Abd: NABS, NT, ND Exts: Non edematous BL  LE Psych: Good eye contact and normal affect.

## 2010-09-16 NOTE — Assessment & Plan Note (Signed)
Dx 2 weeks ago based on a TSH 112.  On levothyroxine 88. Plan to check TSH again in 4 more weeks (6 weeks total).  Will f/u in 4 weeks and titrate dose based on levels.

## 2010-09-16 NOTE — Assessment & Plan Note (Addendum)
No sex thinks could not be pregnant.  Will get UPT next visit if still an issue.  Think due to hypothyroid. However if still having after thyroid is normalizing or having other symptoms will go looking for Sheehan syndrome.

## 2010-11-06 ENCOUNTER — Other Ambulatory Visit: Payer: Self-pay

## 2010-11-06 DIAGNOSIS — E039 Hypothyroidism, unspecified: Secondary | ICD-10-CM

## 2010-11-06 LAB — TSH: TSH: 5.521 u[IU]/mL — ABNORMAL HIGH (ref 0.350–4.500)

## 2010-11-06 NOTE — Progress Notes (Signed)
DREW TSH BAJORDAN, MLS

## 2010-11-10 ENCOUNTER — Ambulatory Visit (INDEPENDENT_AMBULATORY_CARE_PROVIDER_SITE_OTHER): Payer: Self-pay | Admitting: Family Medicine

## 2010-11-10 ENCOUNTER — Encounter: Payer: Self-pay | Admitting: Family Medicine

## 2010-11-10 VITALS — BP 100/66 | HR 62 | Temp 98.3°F | Ht 61.0 in | Wt 122.0 lb

## 2010-11-10 DIAGNOSIS — Z309 Encounter for contraceptive management, unspecified: Secondary | ICD-10-CM

## 2010-11-10 DIAGNOSIS — E039 Hypothyroidism, unspecified: Secondary | ICD-10-CM

## 2010-11-10 MED ORDER — LEVOTHYROXINE SODIUM 100 MCG PO TABS: 100.0000 ug | ORAL_TABLET | Freq: Every day | ORAL | Status: DC

## 2010-11-10 MED ORDER — NORGESTIMATE-ETH ESTRADIOL 0.25-35 MG-MCG PO TABS: 1.0000 | ORAL_TABLET | Freq: Every day | ORAL | Status: DC

## 2010-11-10 NOTE — Progress Notes (Signed)
[name] presents to clinic today to follow up her TSH.  She feels MUCH better today than she did a few months ago. She denies any depression or fatigue. She also notes that her goiter has dramatically reduced. She loves her baby and is bonding with her as well. She is taking levothyroxine 88 daily.   Additionally [name] would like to start back on the birth control. She had her first cycle starting 5 days ago. She is bleeding a small amount. She denies any pain or painful cramps. She notes her mensuration is brown.  She has use OCPs in the past and would like to start this at this visit.   PMH reviewed.  ROS as above otherwise neg  Exam:  Vs noted.  Gen: Well NAD HEENT: EOMI, MMM, small palpable smooth non tender symmetrical thyroid.  Lungs: CTABL Nl WOB Heart: RRR no MRG Abd: NABS, NT, ND Exts: Non edematous BL  LE

## 2010-11-10 NOTE — Assessment & Plan Note (Signed)
Lab Results  Component Value Date   TSH 5.521* 11/06/2010   TSH now is 5. Was 112. Pt symptomatically doing much better. Will increase dose to and follow up in 6 weeks. Will repeat TSH then. Red flags reviewed.

## 2010-11-10 NOTE — Assessment & Plan Note (Signed)
Plan to start sprintec. Will follow up in 6 weeks.

## 2011-01-11 LAB — CBC
HCT: 35.5 — ABNORMAL LOW
MCHC: 34.5
MCV: 88.2
Platelets: 192
RBC: 4.03
WBC: 8.6

## 2011-01-11 LAB — URINALYSIS, ROUTINE W REFLEX MICROSCOPIC
Bilirubin Urine: NEGATIVE
Glucose, UA: NEGATIVE
Protein, ur: NEGATIVE
Urobilinogen, UA: 0.2

## 2011-01-11 LAB — HCG, QUANTITATIVE, PREGNANCY
hCG, Beta Chain, Quant, S: 122 — ABNORMAL HIGH
hCG, Beta Chain, Quant, S: 133 — ABNORMAL HIGH
hCG, Beta Chain, Quant, S: 54 — ABNORMAL HIGH

## 2011-01-11 LAB — ABO/RH: ABO/RH(D): O POS

## 2011-01-11 LAB — WET PREP, GENITAL
Clue Cells Wet Prep HPF POC: NONE SEEN
Yeast Wet Prep HPF POC: NONE SEEN

## 2011-01-11 LAB — POCT PREGNANCY, URINE
Operator id: 130741
Preg Test, Ur: POSITIVE

## 2011-01-11 LAB — URINE MICROSCOPIC-ADD ON

## 2011-01-12 LAB — CBC
MCHC: 35.2
MCV: 86.7
Platelets: 242
RBC: 4.21
WBC: 8.7

## 2011-01-12 LAB — DIFFERENTIAL
Basophils Relative: 0
Eosinophils Absolute: 0
Lymphs Abs: 0.9
Monocytes Relative: 2 — ABNORMAL LOW
Neutro Abs: 7.5
Neutrophils Relative %: 87 — ABNORMAL HIGH

## 2011-01-12 LAB — HCG, QUANTITATIVE, PREGNANCY
hCG, Beta Chain, Quant, S: 3
hCG, Beta Chain, Quant, S: 57 — ABNORMAL HIGH

## 2011-03-05 ENCOUNTER — Encounter: Payer: Self-pay | Admitting: Family Medicine

## 2011-03-05 ENCOUNTER — Ambulatory Visit (INDEPENDENT_AMBULATORY_CARE_PROVIDER_SITE_OTHER): Payer: Self-pay | Admitting: Family Medicine

## 2011-03-05 DIAGNOSIS — Z309 Encounter for contraceptive management, unspecified: Secondary | ICD-10-CM

## 2011-03-05 DIAGNOSIS — E039 Hypothyroidism, unspecified: Secondary | ICD-10-CM

## 2011-03-05 MED ORDER — NORGESTIMATE-ETH ESTRADIOL 0.25-35 MG-MCG PO TABS: 1.0000 | ORAL_TABLET | Freq: Every day | ORAL | Status: DC

## 2011-03-05 MED ORDER — LEVOTHYROXINE SODIUM 100 MCG PO TABS: 100.0000 ug | ORAL_TABLET | Freq: Every day | ORAL | Status: DC

## 2011-03-05 NOTE — Progress Notes (Signed)
Interpreter Wyvonnia Dusky  Dr [name] 9.00

## 2011-03-05 NOTE — Patient Instructions (Signed)
Gracias por venir hoy. Por Favor tome la pastilla para la thyroides. Regresse [employer].   Hipotiroidismo (Hypothyroidism) El hipotiroidismo es un trastorno causado por una escasa actividad de la glndula tiroides. CAUSAS La causa del hipotiroidismo puede ser Neomia Dear enfermedad o Neomia Dear ciruga de tiroides.   SNTOMAS Generalmente son vagos. Ellos puede ser:  Alma Friendly.     Retraso mental.     Debilidad.    Voz ronca.     Constipacin.    Hinchazn de la cara, manos y pies     Piel seca.     Prdida del cabello.     Sensibilidad al fro.     Problemas en el ciclo menstrual.     Es ms probable que estos pacientes sufran infecciones.  DIAGNSTICO El diagnstico se confirma con pruebas de laboratorio para la hormona tiroides (T3, T4, TSH). Estas pruebas tambin se utilizan para Conservation officer, nature. TRATAMIENTO ste consiste en la reposicin gradual de la hormona tiroides. Es muy importante que contine diariamente con los medicamentos que le han prescripto, an despus de sentirse mejor. Probablemente necesite la hormona tiroidea por el resto de su vida. Para reducir la constipacin, ingiera una dieta alta en frutas y verduras. Trate de Materials engineer [employer]. Comunquese con su mdico para Special educational needs teacher de modo que el tratamiento pueda ajustarse a medida que el problema [place].   SOLICITE ATENCIN MDICA DE INMEDIATO SI: Comienza a sentir dolor en el pecho, palpitaciones, falta de aire, u otros sntomas serios. Document Released: 04/05/2005 Document Revised: 12/16/2010 [facility] Patient Information 2012 [place], [place].

## 2011-03-05 NOTE — Assessment & Plan Note (Signed)
Currently off of levothyroxine. Hard to interpret exactly how well she had been going. Plan to resume levothyroxine check TSH now and followup in 2 months. Reinforced importance of refill request when medications expired. Handout on hypothyroidism in Spanish given.

## 2011-03-05 NOTE — Progress Notes (Signed)
[name] presents to clinic today to followup her hypothyroidism. She was taking her levothyroxine as scheduled until 1-1/2 months ago. However she ran out at that point and stop taking the medication and did not for a refill. She is feeling well currently aside from a neck fullness that started about one month ago. She denies any mood disorder such as depression feeling sad feeling cold or tired a little energy.   PMH reviewed.  ROS as above otherwise neg Medications reviewed. Current Outpatient Prescriptions  Medication Sig Dispense Refill  . norgestimate-ethinyl estradiol (ORTHO-CYCLEN,SPRINTEC,PREVIFEM) 0.25-35 MG-MCG tablet Take 1 tablet by mouth daily.  30 tablet  12  . levothyroxine (SYNTHROID, LEVOTHROID) 100 MCG tablet Take 1 tablet (100 mcg total) by mouth daily.  30 tablet  3    Exam:  BP 105/69  Pulse 74  Temp(Src) 98.6 F (37 C) (Oral)  Ht 5\' 1"  (1.549 m)  Wt 118 lb 9.6 oz (53.797 kg)  BMI 22.41 kg/m2  LMP 03/04/2011 Gen: Well NAD HEENT: EOMI,  MMM, smooth bilateral symmetric goiter Lungs: CTABL Nl WOB Heart: RRR no MRG Abd: NABS, NT, ND Exts: Non edematous BL  LE, warm and well perfused.

## 2011-03-06 LAB — TSH: TSH: 3.111 u[IU]/mL (ref 0.350–4.500)

## 2011-04-28 ENCOUNTER — Other Ambulatory Visit: Payer: Self-pay | Admitting: Family Medicine

## 2011-04-28 DIAGNOSIS — E039 Hypothyroidism, unspecified: Secondary | ICD-10-CM

## 2011-05-03 ENCOUNTER — Other Ambulatory Visit: Payer: Self-pay

## 2011-05-03 DIAGNOSIS — E039 Hypothyroidism, unspecified: Secondary | ICD-10-CM

## 2011-05-03 NOTE — Progress Notes (Signed)
TSH DONE TODAY [name] 

## 2011-05-07 ENCOUNTER — Ambulatory Visit (INDEPENDENT_AMBULATORY_CARE_PROVIDER_SITE_OTHER): Payer: Self-pay | Admitting: Family Medicine

## 2011-05-07 VITALS — BP 107/69 | HR 70 | Ht 61.0 in | Wt 120.0 lb

## 2011-05-07 DIAGNOSIS — R5383 Other fatigue: Secondary | ICD-10-CM

## 2011-05-07 DIAGNOSIS — Z309 Encounter for contraceptive management, unspecified: Secondary | ICD-10-CM

## 2011-05-07 DIAGNOSIS — E039 Hypothyroidism, unspecified: Secondary | ICD-10-CM

## 2011-05-07 DIAGNOSIS — R5381 Other malaise: Secondary | ICD-10-CM

## 2011-05-07 MED ORDER — LEVOTHYROXINE SODIUM 100 MCG PO TABS: 100.0000 ug | ORAL_TABLET | Freq: Every day | ORAL | Status: DC

## 2011-05-07 NOTE — Assessment & Plan Note (Signed)
Discussed risks and benefits of Implanon.  [name] is interested and will return to clinic in one to 2 weeks for insertion. Discussed that the most common reason the point Implanon removed is vaginal bleeding.

## 2011-05-07 NOTE — Assessment & Plan Note (Signed)
TSH within range. Plan to continue levothyroxine at 100 mcg. Plan to check TSH in 6 months.

## 2011-05-07 NOTE — Progress Notes (Signed)
[name] is a 31 year old woman who presents to clinic today to followup her hypothyroidism discuss fatigue and contraception. #1 hypothyroidism. Currently taking levothyroxine 100 mcg daily. She feels well recent TSH was 1.8. #2 fatigue: Has felt fatigued for the last several weeks. 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 She wonders if there is any vitamin she can take to help her feel better. She denies any fevers chills palpitations chest pain abdominal pain. #3 contraception: Currently taking oral contraception. We discussed long-term reversible contraception as she is very interested in Implanon device.  PMH reviewed.  ROS as above otherwise neg Medications reviewed. Current Outpatient Prescriptions  Medication Sig Dispense Refill  . levothyroxine (SYNTHROID, LEVOTHROID) 100 MCG tablet Take 1 tablet (100 mcg total) by mouth daily.  90 tablet  2  . norgestimate-ethinyl estradiol (ORTHO-CYCLEN,SPRINTEC,PREVIFEM) 0.25-35 MG-MCG tablet Take 1 tablet by mouth daily.  30 tablet  12  . DISCONTD: levothyroxine (SYNTHROID, LEVOTHROID) 100 MCG tablet Take 1 tablet (100 mcg total) by mouth daily.  30 tablet  3    Exam:  BP 107/69  Pulse 70  Ht 5\' 1"  (1.549 m)  Wt 120 lb (54.432 kg)  BMI 22.67 kg/m2 Gen: Well NAD HEENT: EOMI,  MMM Lungs: CTABL Nl WOB Heart: RRR no MRG Abd: NABS, NT, ND Exts: Non edematous BL  LE, warm and well perfused.   Hb 13

## 2011-05-07 NOTE — Assessment & Plan Note (Signed)
Unsure of etiology. TSH within range. We'll check a point-of-care hemoglobin to ensure no anemia today. We'll followup in a few weeks for Implanon insertion. At that point will screen for depression with a PHQ9

## 2011-05-07 NOTE — Progress Notes (Signed)
Due to language barrier, an interpreter was present during the history-taking and subsequent discussion (and for part of the physical exam) with this patient. Interpreter Wyvonnia Dusky 05/07/2011 Dr [name] 14.30

## 2011-05-17 ENCOUNTER — Ambulatory Visit: Payer: Self-pay | Admitting: Family Medicine

## 2011-06-17 ENCOUNTER — Encounter: Payer: Self-pay | Admitting: Family Medicine

## 2011-06-17 ENCOUNTER — Ambulatory Visit (INDEPENDENT_AMBULATORY_CARE_PROVIDER_SITE_OTHER): Payer: Self-pay | Admitting: Family Medicine

## 2011-06-17 VITALS — BP 99/66 | HR 94 | Temp 97.7°F | Ht 61.0 in | Wt 114.0 lb

## 2011-06-17 DIAGNOSIS — R1110 Vomiting, unspecified: Secondary | ICD-10-CM

## 2011-06-17 DIAGNOSIS — Z309 Encounter for contraceptive management, unspecified: Secondary | ICD-10-CM

## 2011-06-17 DIAGNOSIS — R197 Diarrhea, unspecified: Secondary | ICD-10-CM | POA: Insufficient documentation

## 2011-06-17 MED ORDER — ONDANSETRON HCL 4 MG PO TABS: 4.0000 mg | ORAL_TABLET | Freq: Three times a day (TID) | ORAL | Status: AC | PRN

## 2011-06-17 MED ORDER — PROMETHAZINE HCL 12.5 MG PO TABS: 12.5000 mg | ORAL_TABLET | Freq: Three times a day (TID) | ORAL | Status: DC | PRN

## 2011-06-17 MED ORDER — ONDANSETRON HCL 4 MG PO TABS
4.0000 mg | ORAL_TABLET | Freq: Once | ORAL | Status: AC
  Administered 2011-06-17: 4 mg via ORAL

## 2011-06-17 NOTE — Assessment & Plan Note (Signed)
Likely do to viral gastroenteritis as this is currently in the community.  Also less likely to be serious intra-abdominal pathology as she is not significantly tender, or exhibiting peritoneal findings.    Plan for oral Phenergan and Zofran as needed and oral rehydration.  I do suspect that [name] has mild dehydration.  Discussed with a translator specific warning signs that would prompt her to return to the emergency room or to family practice Center including worsening abdominal pain inability to hold fluids down dizziness and uncontrollable vomiting.  If she is not feeling better she will return to clinic tomorrow.    We will follow.

## 2011-06-17 NOTE — Progress Notes (Signed)
Patient ID: [name], female   DOB: 02/18/1981, 31 y.o.   MRN: [id number] [name] is a 31 y.o. female who presents to [facility] today for   Initially [name] came for Implanon however she has been vomiting and having diarrhea since yesterday.  She is the only one in her household who has been ill.  She notes frequent vomiting and diarrhea. She is able to eat and drink a little bit of fluid and is producing urine although less than usual.  She describes mild bilateral lower abdominal pain but says it's not worsening and it's not severe.  She has not tried any medicine for this yet.     PMH reviewed. Significant for hypothyroidism ROS as above otherwise neg Medications reviewed. Current Outpatient Prescriptions  Medication Sig Dispense Refill  . levothyroxine (SYNTHROID, LEVOTHROID) 100 MCG tablet Take 1 tablet (100 mcg total) by mouth daily.  90 tablet  2  . norgestimate-ethinyl estradiol (ORTHO-CYCLEN,SPRINTEC,PREVIFEM) 0.25-35 MG-MCG tablet Take 1 tablet by mouth daily.  30 tablet  12  . ondansetron (ZOFRAN) 4 MG tablet Take 1-2 tablets (4-8 mg total) by mouth every 8 (eight) hours as needed for nausea.  20 tablet  0  . promethazine (PHENERGAN) 12.5 MG tablet Take 1-2 tablets (12.5-25 mg total) by mouth every 8 (eight) hours as needed for nausea.  20 tablet  0    Exam:  BP 99/66  Pulse 94  Temp(Src) 97.7 F (36.5 C) (Oral)  Ht 5\' 1"  (1.549 m)  Wt 114 lb (51.71 kg)  BMI 21.54 kg/m2 Gen: Well NAD HEENT: EOMI,  MMM Lungs: CTABL Nl WOB Heart: RRR no MRG Abd: NABS, NT, ND no rebound or guarding Exts: Non edematous BL  LE, warm and well perfused. Normal skin turgor   4 mg Zofran oral dissolving tablet given

## 2011-06-17 NOTE — Progress Notes (Signed)
Addended by: Barnie Alderman on: 06/17/2011 09:51 AM   Modules accepted: Orders

## 2011-06-17 NOTE — Patient Instructions (Signed)
Gracias por venir hoy.   Nuseas y Vmitos (Nausea and Vomiting) La nusea es la sensacin de Dentist en el estmago o de la necesidad de vomitar. El vmito es un reflejo por el que los contenidos del estmago salen por la boca. El vmito puede ocasionar prdida de lquidos del organismo (deshidratacin). Los nios y los [employer] pueden deshidratarse rpidamente (en especial si tambin tienen diarrea). Las nuseas y los vmitos son sntoma de un trastorno o enfermedad. Es importante Emergency planning/management officer causa de los sntomas. CAUSAS  Irritacin directa de la membrana que cubre el [place]. Esta irritacin puede ser resultado del aumento de la produccin de cido, (reflujo gastroesofgico), infecciones, intoxicacin alimentaria, ciertos medicamentos (como antinflamatorios no esteroideos), consumo de alcohol o de tabaco.     Seales del cerebro. Estas seales pueden ser un dolor de cabeza, exposicin al calor, trastornos del odo interno, aumento de la presin en el cerebro por lesiones, infeccin, un tumor o conmocin cerebral, estmulos emocionales o problemas metablicos.     Una obstruccin en el tracto gastrointestinal (obstruccin intestinal).     Ciertas enfermedades como la diabetes, problemas en la vescula biliar, apendicitis, problemas renales, cncer, sepsis, sntomas atpicos de infarto o trastornos alimentarios.     Tratamientos mdicos como la quimioterapia y la radiacin.     Medicamentos que inducen al sueo (anestesia general)durante [place].  DIAGNSTICO   El mdico podr solicitarle algunos anlisis si los problemas no mejoran luego de [address]. Tambin podrn pedirle anlisis si los sntomas son graves o si el motivo de los vmitos o las nuseas no est claro. Los [employer] ser:    Anlisis de [place]     Anlisis de [place].     Pruebas de materia fecal.     Cultivos (para buscar evidencias de infeccin).     Radiografas u otros estudios por imgenes.    Los [employer] de las pruebas lo ayudarn al mdico a tomar decisiones acerca del mejor curso de tratamiento o la necesidad de [employer].   TRATAMIENTO   Debe estar bien hidratado. Beba con frecuencia pequeas cantidades de lquido. Puede beber agua, bebidas deportivas, caldos claros o comer pequeos trocitos de hielo o gelatina para mantenerse hidratado. Cuando coma, hgalo lentamente para evitar las nuseas. Hay medicamentos para evitar las nuseas que pueden aliviarlo.   INSTRUCCIONES PARA EL CUIDADO DOMICILIARIO  Si su mdico le prescribe medicamentos tmelos como se le haya indicado.     Si no tiene hambre, no se fuerce a comer. Sin embargo, es necesario que tome lquidos.     Si tiene hambre alimntese con una dieta normal, a menos que el mdico le indique otra cosa.     Los mejores alimentos son Neomia Dear combinacin de carbohidratos complejos (arroz, trigo, papas, pan), carnes magras, yogur, frutas y Sports administrator.     Evite los alimentos ricos en grasas porque dificultan la digestin.     Beba gran cantidad de lquido para mantener la orina de tono claro o color amarillo plido.     Si est deshidratado, consulte a su mdico para que le d instrucciones especficas para volver a hidratarlo Los signos de deshidratacin son:     Franz Dell sed.     Labios y boca secos.     Mareos.    Larose Kells.     Disminucin de la frecuencia y cantidad de la [place].     Confusin.    Tiene el pulso o la respiracin acelerados.  SOLICITE ATENCIN MDICA DE Lanney Gins  SI:  Vomita sangre o algo similar a la borra del caf.     La materia fecal (heces)es negra o tiene [place].     Sufre una cefalea grave o rigidez en el cuello.     Se siente confundido.     Siente dolor abdominal intenso.     Tiene dolor en el pecho o dificultad para respirar.     No orina por 8 horas.     Tiene la piel fra y pegajosa.     Sigue vomitando durante ms de 24 a 48 horas.     Tiene fiebre.   ASEGRESE QUE:    Comprende estas instrucciones.     Controlar su enfermedad.     Solicitar ayuda inmediatamente si no mejora o si empeora.  Document Released: 04/25/2007 Document Revised: 12/16/2010 [facility] Patient Information 2012 [place], [place].  Deshidratacin (Dehydration) La deshidratacin es la disminucion del agua y lquidos en el organismo a un nivel inferior del requerido para un funcionamiento correcto.   CAUSAS La deshidratacin se produce cuando hay una prdida excesiva de lquidos en el organismo o cuando la prdida normal no se repone.  Hay prdida de lquidos en los casos de vmitos, [place], sudoracin excesiva, eliminacin excesiva de [place] o prdida de lquidos en los pulmones (como ocurre en los casos de fiebre elevada o en pacientes ventilados artificialmente)     El reemplazo inadecuado de lquidos puede ocurrir cuando hay nuseas, disminucin del apetito debido a [place] enfermedad o Engineer, mining de garganta o de boca.  SNTOMAS DESHIDRATACIN LEVE  Sed (los bebs y los nios pequeos pueden no ser capaces de decir que tienen sed).     Labios resecos.     Sequedad leve de la mucosa bucal.  DESHIDRATACIN MODERADA  Intensa sequedad de la mucosa bucal.     Ojos hundidos.     Fontanelas (zonas blandas) hundidas en la cabeza del beb.     La piel no vuelve rpidamente a su lugar cuando se suelta luego de pellizcarla ligeramente.     Disminucin de la cantidad [place].     Disminucin de la cantidad de lgrimas.  DESHIDRATACIN GRAVE  Pulso rpido y dbil (ms de 100 por minuto en reposo).     Manos y pies fros.     Prdida de la capacidad para transpirar, independientemente del calor y la temperatura.     Respiracin rpida.     Labios azulados     Confusin, aletargamiento, dificultad para despertar.     Poca produccin de [place].     Falta de lgrimas  DIAGNSTICO El profesional diagnosticar deshidratacin basndose en los sntomas  (indicados ms [place]) y el examen fsico. Las pruebas de sangre y [place] ayudarn a Astronomer el diagnstico. La evaluacin diagnstica suele identificar tambin las causas de la deshidratacin. PREVENCIN El organismo depende de un correcto equilibrio de lquidos y Customer service manager (sales como las de sodio y Government social research officer) para un funcionamiento normal. Es muy importante una adecuada ingesta de lquidos en presencia de una enfermedad u otras causas estresantes (como en la prctica de un ejercicio extremo).   TRATAMIENTO  La deshidratacin leve se puede tratar sin consultar al mdico, siempre que no empeore. Comunquese con el profesional que lo asiste cuando se trate de bebs y [employer], an en los casos de deshidratacin leve.     En los adolescentes y los adultos que sufren una deshidratacin moderada, una atencin esmerada en la casa (como se indica abajo) ser suficiente. Es aconsejable Psychologist, prison and probation services telefnico  con el profesional que lo asiste. Los nios menores de 10 aos con deshidratacin moderada deben ser vistos por un profesional.     Si o usted o su hijo est gravemente deshidratado, debe concurrir a un hospital para Pensions consultant. La administracin de lquidos por va intravenosa (IV) revierte rpidamente la deshidratacin, y con frecuencia salva la vida de nios pequeos, bebs y ancianos.  INSTRUCCIONES PARA EL CUIDADO DOMICILIARIO Deber tomar con frecuencia pequeas cantidades de lquidos. Una gran cantidad de agua puede no tolerarse. El agua sola puede hacer dao a los bebs y [place]. Las soluciones de rehidratacin oral estn disponibles en las farmacias y [place]. Ellas reponen agua y los electrolitos ms importantes en proporciones adecuadas. Las bebidas deportivas no son tan efectivas y pueden ser nocivas debido al contenido de azcar que puede [employer] diarrea.  Como directiva general para los nios, reponga toda nueva prdida de lquidos ocasionada por diarrea o  vmitos con SRO o lquidos claros del siguiente modo:     Si el nio pesa10 kg o menos (22 libras o menos), ofrzcale 60-120 ml ( -1/2 taza o 2 - 4 onzas) de SRO en cada episodio de deposicin diarreica o vmito.     Si el nio pesa 10 Kg o ms (22 libras o ms), ofrzcale 120-240 ml (1/2 - 1 taza o 4-8 onzas) de SRO en cada episodio de vmito o diarrea.     Si el nio tiene vmitos, ser til Ship broker de SRO en 5 ml (1 cucharadita) cada 5 minutos, y luego aumentar segn la tolerancia.     Mientras se corrige la Tax adviser. Sin embargo, los alimentos con alto contenido de Programme researcher, broadcasting/film/video porque pueden [employer] diarrea. Deben evitarse las bebidas carbonatadas en grandes cantidades, jugos, postres de gelatina y otros lquidos altamente azucarados.     Luego de [employer] deshidratacin, se le pueden dar al [employer] lquidos. El nio deber beber con frecuencia pequeas cantidades de lquido y aumentarlos segn su tolerancia. Los nios debern beber gran cantidad de lquido para Pharmacologist la orina de tono claro o color amarillo plido.     Los adultos debern comer normalmente y beber ms lquidos que lo habitual. Beba con frecuencia pequeas cantidades de lquido y aumente segn su tolerancia. Hay que beber gran cantidad de lquido para mantener la orina de tono claro o color amarillo plido. Los caldos, el t [place], las bebidas de lima limn (sin burbujas) y los lquidos de bajas caloras incorporan lquidos y Customer service manager.     Evite:    Gaseosas    Jugos.    Lquidos muy calientes o fros     Bebidas con cafena.     Alimentos con alto contenido graso.     Alcohol.    Tabaco    Comer demasiado a la vez     Postres de Network engineer.     Los probiticos son cultivos activos de bacterias benficas. Pueden ayudar a disminuir la diarrea en adultos. Los probiticos puede encontrarse en el yogur con cultivos activos y en  suplementos.     Lave bien sus manos para evitar que los grmes (bacterias) o los virus se diseminen.     No son recomendables los medicamentos antidiarreicos en bebs y nios.     Slo tome medicamentos de Sales promotion account executive o prescriptos para Primary school teacher, las molestias o bajar la fiebre segn las indicaciones de su mdico. No le administre aspirina a [place]  nio porque puede causarle el [place].     Para adultos con deshidratacin, pregunte a su mdico si debe continuar con los medicamentos prescriptos y de [place].     Si el mdico le ha dado fecha para una visita de control, es importante que concurra. No cumplir con este control puede dar como resultado que el dao o la discapacidad sean permanentes. Si tiene problemas para asistir al control, deber [employer] establecimiento para recibir asesoramiento.  SOLICITE ATENCIN MDICA DE INMEDIATO SI:  Usted o su hijo no pueden retener lquidos u otros sntomas o trastornos empeoran a Designer, industrial/product.     Aparecen vmitos o diarrea, o se vuelven persistentes.     Hay vmitos de sangre o bilis (material verde)     Hay sangre en la materia fecal o sta es de aspecto negro alquitranado.     No hay emisin de [place] durante 6 a 8 horas o se elimina una pequea cantidad de [place].     Aparece dolor abdominal, o ste aumenta o se localiza.     Usted o su nio tienen una temperatura oral de ms de 102 F (38.9 C) y no puede controlarla con medicamentos.     Su beb tiene ms de 3 meses y su temperatura rectal es de 102 F (38.9 C) o ms.     Su beb tiene 3 meses o menos y su temperatura rectal es de 100.4 F (38 C) o ms.     Usted o su hijo presentan debilidad excesiva, mareos, lipotimia o sed extrema.     Usted o su nio presentan una erupcin, contractura en el cuello, cefalea intensa, irritabilidad o tiene dificultades para despertarlo.  ASEGRESE QUE:  Comprende estas instrucciones.     Controlar  su enfermedad.     Solicitar ayuda inmediatamente si no mejora o si empeora.  Document Released: 04/05/2005 Document Revised: 10/19/2010 [facility] Patient Information 2012 [place], [place].

## 2011-06-18 ENCOUNTER — Ambulatory Visit: Payer: Self-pay

## 2011-06-28 ENCOUNTER — Encounter: Payer: Self-pay | Admitting: Family Medicine

## 2011-06-28 ENCOUNTER — Ambulatory Visit (INDEPENDENT_AMBULATORY_CARE_PROVIDER_SITE_OTHER): Payer: Self-pay | Admitting: Family Medicine

## 2011-06-28 VITALS — BP 97/67 | Temp 97.9°F | Ht 61.0 in | Wt 115.6 lb

## 2011-06-28 DIAGNOSIS — Z309 Encounter for contraceptive management, unspecified: Secondary | ICD-10-CM

## 2011-06-28 DIAGNOSIS — Z3046 Encounter for surveillance of implantable subdermal contraceptive: Secondary | ICD-10-CM

## 2011-06-28 DIAGNOSIS — Z30017 Encounter for initial prescription of implantable subdermal contraceptive: Secondary | ICD-10-CM

## 2011-06-28 MED ORDER — ETONOGESTREL 68 MG ~~LOC~~ IMPL
68.0000 mg | DRUG_IMPLANT | Freq: Once | SUBCUTANEOUS | Status: AC
  Administered 2011-06-28: 68 mg via SUBCUTANEOUS

## 2011-06-28 NOTE — Patient Instructions (Signed)
Gracias por venir hoy.  Mantenga cuvierito su brazo por un dia.  Puede tomar un bano manana.  Regrese a su activida normal manana.

## 2011-06-28 NOTE — Progress Notes (Signed)
[name] is a 31 y.o. female who presents to [facility] today for Implanon insertion.  We have discussed this in the past discussed side effects benefits and risks and she agrees to proceed. It's been over one month and she had sex her last menstrual period was on March 3.  Patient is right-handed   PMH reviewed. Significant for hypothyroidism  Exam:  BP 97/67  Temp(Src) 97.9 F (36.6 C) (Oral)  Ht 5\' 1"  (1.549 m)  Wt 115 lb 9.6 oz (52.436 kg)  BMI 21.84 kg/m2  LMP 06/20/2011 Gen: Well NAD ARM: Left arm is normal appearing.  Implanon insertion: Consent obtained and timeout performed.  Area measured and marked.  Area 8 centimeters proximal to lateral upper condyle injected with 5ml of lidocaine with epinephrine. A small amount of lidocaine was placed to 12 cm.   Good anesthesia noted. Area prepped with Betadine and draped with sterile dressing.  Using sterile technique Implanon device was placed in the previously marked area.  Wound was covered with Steri-Strip and a pressure dressing.  Patient tolerated procedure well with minimal bleeding.  Device palpable following procedure.

## 2011-06-28 NOTE — Assessment & Plan Note (Signed)
Implanon inserted today. Plan for routine followup

## 2011-09-17 ENCOUNTER — Encounter: Payer: Self-pay | Admitting: Family Medicine

## 2011-09-17 ENCOUNTER — Ambulatory Visit (INDEPENDENT_AMBULATORY_CARE_PROVIDER_SITE_OTHER): Payer: Self-pay | Admitting: Family Medicine

## 2011-09-17 VITALS — BP 105/71 | HR 82 | Ht 61.0 in | Wt 116.0 lb

## 2011-09-17 DIAGNOSIS — N921 Excessive and frequent menstruation with irregular cycle: Secondary | ICD-10-CM

## 2011-09-17 DIAGNOSIS — E039 Hypothyroidism, unspecified: Secondary | ICD-10-CM

## 2011-09-17 NOTE — Assessment & Plan Note (Signed)
Doing well plan to check TSH today

## 2011-09-17 NOTE — Patient Instructions (Signed)
Gracias por venir hoy.  Regresse en 4 meses.

## 2011-09-17 NOTE — Progress Notes (Signed)
[name] is a 31 y.o. female who presents to [facility] today for irregular vaginal bleeding.  Patient received Implanon in March of 2013. Starting a few weeks after the insertion of Implanon she has had irregular heavy vaginal bleeding. She will have vaginal bleeding most days of the month with some days being heavy using up to 4 pads a day.  She will have some days of no bleeding.  She does not have any pain and has not felt lightheaded or dizzy or feel like she will pass out.    For hypothyroidism she is currently taking levothyroxine and feeling well.     PMH: Reviewed significant for hypothyroidism currently on levothyroxine History  Substance Use Topics  . Smoking status: Former Games developer  . Smokeless tobacco: Not on file  . Alcohol Use: No   ROS as above  Medications reviewed. Current Outpatient Prescriptions  Medication Sig Dispense Refill  . etonogestrel (IMPLANON) 68 MG IMPL implant Inject 1 each into the skin once.      Marland Kitchen levothyroxine (SYNTHROID, LEVOTHROID) 100 MCG tablet Take 1 tablet (100 mcg total) by mouth daily.  90 tablet  2  . DISCONTD: norgestimate-ethinyl estradiol (ORTHO-CYCLEN,SPRINTEC,PREVIFEM) 0.25-35 MG-MCG tablet Take 1 tablet by mouth daily.  30 tablet  12    Exam:  BP 105/71  Pulse 82  Ht 5\' 1"  (1.549 m)  Wt 116 lb (52.617 kg)  BMI 21.92 kg/m2 Gen: Well NAD HEENT: EOMI,  MMM, no goiter Lungs: CTABL Nl WOB Heart: RRR no MRG Abd: NABS, NT, ND Exts: Non edematous BL  LE, warm and well perfused.   No results found for this or any previous visit (from the past 72 hour(s)).

## 2011-09-17 NOTE — Assessment & Plan Note (Signed)
Do to Implanon device.  Discussed options of watchful waiting for 4 more months, versus starting oral contraceptive pills.  Patient elects for watchful waiting and will followup in 4 months if still having a problem. Verbally discussed with a translator signs or symptoms on prompt return.  She expresses understanding

## 2012-01-07 ENCOUNTER — Telehealth: Payer: Self-pay | Admitting: Family Medicine

## 2012-01-07 DIAGNOSIS — E039 Hypothyroidism, unspecified: Secondary | ICD-10-CM

## 2012-01-07 NOTE — Telephone Encounter (Signed)
Pt will like to has a TSH check as a routine in Nov. Please can Dr. Casper Harrison set up the order for lab?Marland Kitchen Thank You

## 2012-01-07 NOTE — Telephone Encounter (Signed)
Called pt. Unable to communicate. Will ask Marines to call pt and request to schedule OV with new PCP. [name]

## 2012-01-08 NOTE — Telephone Encounter (Signed)
Reviewed chart. Pt had previously been seen by Dr. [name] fairly often for hypothyroidism; pt had at one point ran out of medication and went several ?weeks/months without taking it and did not request a refill (this was around ?Oct-Nov of last year). Last TSH check was in May of this year, within normal range and pt was "feeling well" at that time. Per Dr. Zollie Pee note at that time, he had made no definite plans for follow-up but a check in November is reasonable (six months since the last check).  Please have pt schedule a visit with me (or whoever is available, per her convenience/preference) in November. I will put in a "future" order for a TSH check, so that she can schedule a lab visit and then a follow-up visit with me, if it works out easier for her and my schedule. Thanks! --CMS

## 2012-01-10 NOTE — Telephone Encounter (Signed)
Thank You ° °Marines °

## 2012-02-29 ENCOUNTER — Other Ambulatory Visit: Payer: Self-pay

## 2012-02-29 DIAGNOSIS — E039 Hypothyroidism, unspecified: Secondary | ICD-10-CM

## 2012-02-29 NOTE — Progress Notes (Signed)
TSH DONE TODAY [name] 

## 2012-03-03 ENCOUNTER — Ambulatory Visit (INDEPENDENT_AMBULATORY_CARE_PROVIDER_SITE_OTHER): Payer: Self-pay | Admitting: Family Medicine

## 2012-03-03 ENCOUNTER — Encounter: Payer: Self-pay | Admitting: Family Medicine

## 2012-03-03 VITALS — BP 106/68 | HR 76 | Temp 98.4°F | Ht 61.0 in | Wt 120.0 lb

## 2012-03-03 DIAGNOSIS — Z3046 Encounter for surveillance of implantable subdermal contraceptive: Secondary | ICD-10-CM

## 2012-03-03 DIAGNOSIS — E039 Hypothyroidism, unspecified: Secondary | ICD-10-CM

## 2012-03-03 DIAGNOSIS — Z309 Encounter for contraceptive management, unspecified: Secondary | ICD-10-CM

## 2012-03-03 NOTE — Assessment & Plan Note (Signed)
A: TSH checked 11/12, within normal range. Some possible new symptoms related to hypothyroidism for about 2 months, since pt ran out of medication. P: TSH normal off of medication. Will monitor clinically for now. Pt instructed to follow up in 1-2 months, at which time I will consider checking another TSH vs restarted Synthroid; pt was not accompanied by an interpreter today, and the visit was precepted by Dr. Mauricio Po, who assisted with Spanish interpretation.

## 2012-03-03 NOTE — Progress Notes (Signed)
  Subjective:    Patient ID: [name], female    DOB: 1980/10/09, 31 y.o.   MRN: [id number]  HPI:  Pt presents for f/u of hypothyroidism and wishes to have her Implanon removed.  Thyroid function: Per chart review, pt has had some intermittently poor compliance and follow-up with Synthroid. There was a period about one year ago during which pt ran out of medication and did not request a refill for weeks/months. However, pt was taking her Synthroid at her last visit in May, and states she ran out "about 2 months ago." Since then, she has had some increased "thickness" or "fullness" in her neck. She has also felt more stressed and has started smoking (see below). She denies other complaints though and recently had her blood checked (earlier this week).  Implanon: placed in March of this year, pt with intermittent complaint of increased bleeding. Per previous notes pt wished to continue Implanon and "watch and wait." However, she would now like it removed. She has not had any further bleeding for about four months, but she has been taking OCP's "from [place]" (she does not have them with her today and does not know the name/brand/dose). She plans to continue OCP's after Implanon is removed.  Pt reports she started smoking about a month ago, smoking up to 3/4 of a pack per day (though not while at home or around her daughter); this has been increased the past several days, since she started.  Review of Systems: As above. Pt otherwise denies N/V, fever/chills, abdominal or chest pain.     Objective:   Physical Exam BP 106/68  Pulse 76  Temp 98.4 F (36.9 C) (Oral)  Ht 5\' 1"  (1.549 m)  Wt 120 lb (54.432 kg)  BMI 22.67 kg/m2 Gen: adult female in NAD, pleasant and cooperative with exam Neck: thyroid midline, not enlarged, no nodules appreciated Cardio: RRR, no murmur appreciated Pulm: CTAB, no wheezes Ext: distal pulses intact/symmetric; left upper arm with Implanon palpable under the  skin on the anterior surface of the arm, approximately 5 cm proximal to the antecubital fossa, with no overlying erythema or swelling      Assessment & Plan:  Precepted with Dr. Mauricio Po, who also assisted with Spanish interpretation. See problem list for specific A&P details.  Procedure Note for removal of Implanon: Informed consent was obtained, discussing reasons for removal of Implanon, alternative contraceptive options, and risks of procedure ( Pt was in agreement and the procedure was undertaken. Procedure description: The pt's arm was examined and the area of skin overlying the Implanon implant was marked with a skin pen. Approximately 1 mL of lidocaine was infiltrated just deep to the implant, and another 0.5 mL of lidocaine was infiltrated in the skin just distal to the distal tip of the implant. The skin was cleaned with iodine and I changed into sterile gloves. Using a #11-blade scalpel, an approximately 1/4" skin incision was made perpendicular to the long axis of the implant, approximately 1/8" distal to the distal end of the implant. Using one hand, the implant was stabilized under the skin and a subcutaneous fibrous capsule surrounding the implant was incised, with the implant then pushed out through the skin incision. The skin was then cleaned and bandaged. There were no immediate complications. Hemostasis: achieved with pressure and adhesive bandage. Total procedure time: 10 minutes Estimated blood loss: <3 mL

## 2012-03-03 NOTE — Assessment & Plan Note (Signed)
A: Implanon removed today without immediate complication. No bleeding reported 11/15 for roughly 4 months though pt has been taking OCP's "from [place]." She does not know the names/doses off the top of her head and does not have her pills with her, today.  P: Pt given red flags for when to return to clinic if site of removal begins to look infected. Will follow-up with pt; instructed her today to bring her OCP's with her to her next visit, at which time I will try to prescribe a similar/appropriate med so that we can be sure of what she is taking and follow up appropriately.

## 2012-03-03 NOTE — Patient Instructions (Addendum)
Your TSH (thyroid blood test) was normal. We will make an appointment to check it again. Please schedule an interpreter with your next visit. Bring your medication (oral pills) with you to your next visit. Your Implanon was removed easily without problem. Keep the bandage on until tonight. Then you can bathe and clean the area as normal. You may use a bandaid as needed. If you have any redness, swelling, or pain in the area, or if you have any fevers, nausea, or vomiting, call the clinic or go to the emergency room. Come back to see me in about 1 or 2 months.

## 2015-05-20 ENCOUNTER — Other Ambulatory Visit (INDEPENDENT_AMBULATORY_CARE_PROVIDER_SITE_OTHER): Payer: Self-pay

## 2015-05-20 DIAGNOSIS — Z3481 Encounter for supervision of other normal pregnancy, first trimester: Secondary | ICD-10-CM

## 2015-05-20 NOTE — Progress Notes (Signed)
New ob labs done today [name] 

## 2015-05-21 LAB — OBSTETRIC PANEL
Antibody Screen: NEGATIVE
BASOS PCT: 0 % (ref 0–1)
Basophils Absolute: 0 10*3/uL (ref 0.0–0.1)
EOS ABS: 0.1 10*3/uL (ref 0.0–0.7)
Eosinophils Relative: 1 % (ref 0–5)
HEMATOCRIT: 34.1 % — AB (ref 36.0–46.0)
HEMOGLOBIN: 11.6 g/dL — AB (ref 12.0–15.0)
HEP B S AG: NEGATIVE
Lymphocytes Relative: 22 % (ref 12–46)
Lymphs Abs: 1.8 10*3/uL (ref 0.7–4.0)
MCH: 30.4 pg (ref 26.0–34.0)
MCHC: 34 g/dL (ref 30.0–36.0)
MCV: 89.3 fL (ref 78.0–100.0)
MONO ABS: 0.4 10*3/uL (ref 0.1–1.0)
MONOS PCT: 5 % (ref 3–12)
MPV: 10.2 fL (ref 8.6–12.4)
Neutro Abs: 6 10*3/uL (ref 1.7–7.7)
Neutrophils Relative %: 72 % (ref 43–77)
Platelets: 246 10*3/uL (ref 150–400)
RBC: 3.82 MIL/uL — ABNORMAL LOW (ref 3.87–5.11)
RDW: 13.4 % (ref 11.5–15.5)
RH TYPE: POSITIVE
Rubella: 10.9 Index — ABNORMAL HIGH (ref <0.90)
WBC: 8.4 10*3/uL (ref 4.0–10.5)

## 2015-05-21 LAB — HIV ANTIBODY (ROUTINE TESTING W REFLEX): HIV: NONREACTIVE

## 2015-05-25 LAB — CULTURE, OB URINE

## 2015-05-27 ENCOUNTER — Other Ambulatory Visit (HOSPITAL_COMMUNITY): Admission: RE | Admit: 2015-05-27 | Payer: Self-pay | Source: Ambulatory Visit | Admitting: Family Medicine

## 2015-05-27 ENCOUNTER — Other Ambulatory Visit: Payer: Self-pay | Admitting: Internal Medicine

## 2015-05-27 ENCOUNTER — Ambulatory Visit (INDEPENDENT_AMBULATORY_CARE_PROVIDER_SITE_OTHER): Payer: Self-pay | Admitting: Internal Medicine

## 2015-05-27 ENCOUNTER — Encounter: Payer: Self-pay | Admitting: Internal Medicine

## 2015-05-27 VITALS — BP 102/56 | HR 79 | Temp 98.1°F | Wt 133.0 lb

## 2015-05-27 DIAGNOSIS — Z3482 Encounter for supervision of other normal pregnancy, second trimester: Secondary | ICD-10-CM

## 2015-05-27 DIAGNOSIS — E039 Hypothyroidism, unspecified: Secondary | ICD-10-CM

## 2015-05-27 DIAGNOSIS — O2340 Unspecified infection of urinary tract in pregnancy, unspecified trimester: Secondary | ICD-10-CM

## 2015-05-27 DIAGNOSIS — O0990 Supervision of high risk pregnancy, unspecified, unspecified trimester: Secondary | ICD-10-CM

## 2015-05-27 DIAGNOSIS — Z8619 Personal history of other infectious and parasitic diseases: Secondary | ICD-10-CM

## 2015-05-27 DIAGNOSIS — Z124 Encounter for screening for malignant neoplasm of cervix: Secondary | ICD-10-CM

## 2015-05-27 LAB — GLUCOSE, CAPILLARY
Comment 1: 1
GLUCOSE-CAPILLARY: 121 mg/dL — AB (ref 65–99)

## 2015-05-27 MED ORDER — CEPHALEXIN 500 MG PO CAPS: 500.0000 mg | ORAL_CAPSULE | Freq: Two times a day (BID) | ORAL | Status: AC

## 2015-05-27 NOTE — Progress Notes (Signed)
[name] is a 35 yo [phone] presenting for initial OB visit.   Patient is unsure of LMP. She reports two days of dark bleeding in September, with one day of bleeding each in October and November. She took a pregnancy test in November, which was positive. She did not seek medical attention at that time because she was unsure if she wanted to continue the pregnancy, as she is already 35 yo and is a single mother. Patient is still involved with the baby's father, though they do not live together, and he is not the father of her other child (a 57 yr old daughter).  Patient has a history of three spontaneous abortions all at [redacted] weeks gestation. Her last pregnancy was 5 years ago. She was induced at 37 weeks due to preeclampsia, and required emergent C-section due to arrest of labor (reports that she was in labor for 3 days prior to the CS).  Patient reports history of genital herpes ~10 years ago. She says she had a procedure in [place] during which the "herpes was lasered off", and says she has not had an outbreak since then. Patient also reports history of hypothyroidism, though says she has not taken medication for the past four years.  Patient was smoking one cigarette a week, but stopped when she found out she was pregnant. Does not drink alcohol or use illicit drugs. No second-hand smoke exposure. Lives at home with her five year-old daughter. Patient feels safe at home and with the baby's father.  She has been taking prenatal vitamins, and takes no other medications.   Denies contractions, vaginal bleeding or discharge. Endorses fetal movement.   Physical Exam:  Fundus palpated at umbilicus.  Fetal heart sounds present.   General: well-appearing, well-nourished, in NAD HEENT: NCAT, MMM CV: RRR, no murmurs appreciated Pulm: Normal work of breathing Abd: Gravid, non-tender GU: normal external genitalia. No cervical or adnexal tenderness. Scant white discharge present with no odor.   Neuro: A&Ox3, no focal deficits Psych: Appropriate mood and affect  A&P  [name] is a 35 yo [phone] presenting for initial OB visit.   High-risk pregnancy Clinic [facility] Prenatal Labs  Dating  Blood type: O pos  Genetic Screen 1 Screen:    AFP:     Quad:     NIPS: Antibody: Neg  Anatomic [place]  Rubella: Immune  GTT Early:               Third trimester:  RPR: Non-reactive  Flu vaccine No HBsAg: Neg  TDaP vaccine                                               Rhogam: HIV: Neg  Baby Food  Breast and bottle                                 GBS:@GBSRESULTS @(For PCN allergy, check sensitivities)  Contraception  IUD Pap:  Circumcision    Pediatrician  [facility]   Support Person     - Referral to High Risk Clinic given history of three first-trimester spontaneous abortions, advanced maternal age, and late to prenatal care - Complete ultrasound - Pap today with GC/chlamydia - 1 hr glucola today - F/u in one month  Hypothyroid Last TSH 1.816 on 05/03/11.  - Check TSH  today - Will contact patient with results and resume Synthroid. Given that patient is pregnant, will increase dose by 25% of normal.   History of herpes genitalis - Begin Valtrex at [redacted] weeks gestation  UTI (urinary tract infection) during pregnancy Asymptomatic but >100,000 colonies E.coli in urine culture. Although patient denies symptoms, will treat since she is pregnant.  - Keflex BID x7 days - Needs test of cure in 2 weeks (repeat OB urine culture)  Tarri Abernethy, MD PGY-1 [facility]

## 2015-05-27 NOTE — Addendum Note (Signed)
Addended by: Tarri Abernethy on: 05/27/2015 04:53 PM   Modules accepted: Orders

## 2015-05-27 NOTE — Assessment & Plan Note (Signed)
Last TSH 1.816 on 05/03/11.  - Check TSH today - Will contact patient with results and resume Synthroid. Given that patient is pregnant, will increase dose by 25% of normal.

## 2015-05-27 NOTE — Assessment & Plan Note (Signed)
Asymptomatic but >100,000 colonies E.coli in urine culture. Although patient denies symptoms, will treat since she is pregnant.  - Keflex BID x7 days - Needs test of cure in 2 weeks (repeat OB urine culture)

## 2015-05-27 NOTE — Assessment & Plan Note (Signed)
-   Begin Valtrex at [redacted] weeks gestation

## 2015-05-27 NOTE — Assessment & Plan Note (Addendum)
Clinic [facility] Prenatal Labs  Dating  Blood type: O pos  Genetic Screen 1 Screen:    AFP:     Quad:     NIPS: Antibody: Neg  Anatomic [place]  Rubella: Immune  GTT Early:               Third trimester:  RPR: Non-reactive  Flu vaccine No HBsAg: Neg  TDaP vaccine                                               Rhogam: HIV: Neg  Baby Food  Breast and bottle                                 GBS:@GBSRESULTS @(For PCN allergy, check sensitivities)  Contraception  IUD Pap:  Circumcision    Pediatrician  [facility]   Support Person     - Referral to High Risk Clinic given history of three first-trimester spontaneous abortions, advanced maternal age, and late to prenatal care - Complete ultrasound - Pap today with GC/chlamydia - 1 hr glucola today - F/u in one month

## 2015-05-27 NOTE — Patient Instructions (Signed)
It was nice meeting you today!   To treat your urinary tract infection, please take the antibiotic (Macrobid) twice a day for the next seven days (last dose in the morning on 06/03/15).   It is important to begin taking prenatal vitamins every day. Please be sure your prenatal vitamins contain iron.   Be well,  Dr. Natale Milch  Primer trimestre de [name] (First Trimester of Pregnancy) El primer trimestre de Psychiatrist se extiende desde la semana1 hasta el final de la semana12 (mes1 al mes3). Durante este tiempo, el beb comenzar a desarrollarse dentro suyo. Entre la semana6 y [place], se forman los ojos y [place], y los latidos del corazn pueden verse en la ecografa. Al final de las 12semanas, todos los rganos del beb estn formados. La atencin prenatal es toda la asistencia mdica que usted recibe antes del nacimiento del beb. Asegrese de recibir una buena atencin prenatal y de seguir todas las indicaciones del mdico. CUIDADOS EN EL HOGAR  Medicamentos:  Tome los medicamentos solamente como se lo haya indicado el mdico. Algunos medicamentos se pueden tomar durante el Psychiatrist y otros no.  Tome las vitaminas prenatales como se lo haya indicado el mdico.  Tome el medicamento que la ayuda a Advertising copywriter (laxante [place]) segn sea necesario, si el mdico lo autoriza. Dieta  Ingiera alimentos saludables de [place] regular.  El Firefighter la cantidad de peso que [place].  No coma carne cruda ni quesos sin cocinar.  Si tiene Programme researcher, broadcasting/film/video (nuseas) o vomita:  Ingiera 4 o 5comidas pequeas por Geophysical data processor de 3abundantes.  Intente comer algunas galletitas saladas.  Beba lquidos [employer], en lugar de Sports coach.  Si tiene dificultad para defecar (estreimiento):  Consuma alimentos con alto contenido de [place], como verduras y frutas frescos, y Radiation protection practitioner.  Beba suficiente lquido para mantener el pis (orina) claro o de color  amarillo plido. Actividad y ejercicios  Haga ejercicios solamente como se lo haya indicado el mdico. Deje de hacer ejercicios si tiene clicos o dolor en la parte baja del vientre (abdomen) o en la cintura.  Intente no estar de pie FedEx. Mueva las piernas con frecuencia si debe estar de pie en un lugar durante mucho tiempo.  Evite levantar pesos [employer] Brands.  Use zapatos con tacones bajos. Mantenga una buena postura al sentarse y pararse.  Puede tener [employer] [place] [employer]ers, a menos que el mdico le indique lo contrario. Alivio del dolor o las molestias  Use un sostn que le brinde buen soporte si le duelen las [place].  Dese baos con agua tibia (baos de asiento) para Engineer, materials o las molestias a causa de las hemorroides. Use crema antihemorroidal si el mdico se lo permite.  Descanse con las piernas elevadas si tiene calambres o dolor de cintura.  Use medias de descanso si tiene las venas de las piernas hinchadas y abultadas (venas varicosas). Eleve los pies durante , 3 o 4veces por da. Limite la cantidad de sal en su dieta. Cuidados prenatales  Programe las visitas prenatales para la semana12 de [place].  Escriba sus preguntas. Llvelas cuando concurra a las visitas prenatales.  Concurra a todas las visitas prenatales como se lo haya indicado el mdico. Seguridad  Colquese el cinturn de seguridad cuando conduzca.  Haga una lista con los nmeros de telfono en caso de Associate Professor, en la cual deben incluirse los nmeros de los familiares, los amigos, el hospital y los departamentos de [place] y de  bomberos. Consejos generales  Pdale al mdico que la derive a clases prenatales en su localidad. Debe comenzar a tomar las clases antes de Cytogeneticist en el mes6 de embarazo.  Pida ayuda si necesita asesoramiento o asistencia con la alimentacin. El mdico puede aconsejarla o indicarle dnde recurrir para recibir [place].  No se d baos de inmersin en  agua caliente, baos turcos ni saunas.  No se haga duchas vaginales ni use tampones o toallas higinicas perfumadas.  No mantenga las piernas cruzadas durante [place].  Evite el contacto con las bandejas sanitarias de los gatos y la tierra que estos animales usan.  No fume, no consuma hierbas ni beba alcohol. No tome frmacos que el mdico no haya autorizado.  No consuma ningn producto que contenga tabaco, lo que incluye cigarrillos, tabaco de Theatre manager o Administrator, Civil Service. Si necesita ayuda para dejar de fumar, consulte al American Express. Puede recibir asesoramiento u otro tipo de apoyo para dejar de fumar.  Visite al dentista. En su casa, lvese los dientes con un cepillo dental suave. Psese el hilo dental con suavidad. SOLICITE AYUDA SI:  Tiene mareos.  Tiene clicos leves o siente presin en la parte baja del vientre.  Siente un dolor persistente en la zona del vientre.  Sigue teniendo AT&T, vomita o las heces son lquidas (diarrea).  Observa una secrecin, con mal olor que proviene de la vagina.  Siente dolor al ConocoPhillips.  Tiene el rostro, las [place], las piernas o los tobillos ms hinchados (inflamados). SOLICITE AYUDA DE INMEDIATO SI:   Tiene fiebre.  Tiene una prdida de lquido por la vagina.  Tiene sangrado o pequeas prdidas vaginales.  Tiene clicos o dolor muy intensos en el vientre.  Sube o baja de peso rpidamente.  Vomita sangre. Puede ser similar a la borra del caf  Est en contacto con personas que tienen rubola, la quinta enfermedad o varicela.  Siente un dolor de cabeza muy intenso.  Le falta el aire.  Sufre cualquier tipo de traumatismo, por ejemplo, debido a una cada o un accidente automovilstico.   Esta informacin no tiene Theme park manager el consejo del mdico. Asegrese de hacerle al mdico cualquier pregunta que tenga.   Document Released: 07/02/2008 Document Revised: 04/26/2014 Elsevier Interactive Patient  Education Yahoo[employer] Inc.

## 2015-05-28 ENCOUNTER — Other Ambulatory Visit: Payer: Self-pay

## 2015-05-28 DIAGNOSIS — E039 Hypothyroidism, unspecified: Secondary | ICD-10-CM

## 2015-05-28 LAB — TSH: TSH: 1.2 m[IU]/L

## 2015-05-28 NOTE — Progress Notes (Signed)
tsh done today [name] 

## 2015-05-29 LAB — CYTOLOGY - PAP

## 2015-06-02 ENCOUNTER — Ambulatory Visit (HOSPITAL_COMMUNITY): Admission: RE | Admit: 2015-06-02 | Payer: Self-pay | Source: Ambulatory Visit

## 2015-06-05 ENCOUNTER — Other Ambulatory Visit: Payer: Self-pay | Admitting: Internal Medicine

## 2015-06-05 ENCOUNTER — Ambulatory Visit (HOSPITAL_COMMUNITY)
Admission: RE | Admit: 2015-06-05 | Discharge: 2015-06-05 | Disposition: A | Discharge status: Home / Self Care | Payer: Self-pay | Source: Ambulatory Visit | Attending: Family Medicine | Admitting: Family Medicine

## 2015-06-05 DIAGNOSIS — Z3689 Encounter for other specified antenatal screening: Secondary | ICD-10-CM

## 2015-06-05 DIAGNOSIS — Z3A23 23 weeks gestation of pregnancy: Secondary | ICD-10-CM

## 2015-06-05 DIAGNOSIS — O09522 Supervision of elderly multigravida, second trimester: Secondary | ICD-10-CM

## 2015-06-05 DIAGNOSIS — Z36 Encounter for antenatal screening of mother: Secondary | ICD-10-CM | POA: Insufficient documentation

## 2015-06-05 DIAGNOSIS — O0990 Supervision of high risk pregnancy, unspecified, unspecified trimester: Secondary | ICD-10-CM

## 2015-06-23 ENCOUNTER — Ambulatory Visit (INDEPENDENT_AMBULATORY_CARE_PROVIDER_SITE_OTHER): Payer: Self-pay | Admitting: Internal Medicine

## 2015-06-23 VITALS — BP 96/50 | HR 78 | Temp 98.3°F | Wt 137.4 lb

## 2015-06-23 DIAGNOSIS — O2340 Unspecified infection of urinary tract in pregnancy, unspecified trimester: Secondary | ICD-10-CM

## 2015-06-23 NOTE — Patient Instructions (Addendum)
It was nice seeing you again today!  I will see you again in one month.   If you have any vaginal bleeding, loss of fluids (feel a gush of water or liquid), abdominal pain, or contractions, please call the office or go to the emergency room.   If you have any questions or concerns in the meantime, please feel free to call the office at any time.   Be well,  Dr. Lovina Reach trimestre de Vanetta Mulders (Second Trimester of Pregnancy) El segundo trimestre va desde la semana13 hasta la 28, desde el cuarto hasta el sexto mes, y suele ser el momento en el que mejor se siente. En general, las nuseas matutinas han disminuido o han desaparecido completamente. Tendr ms energa y podr aumentarle el apetito. El beb por nacer (feto) se desarrolla rpidamente. Hacia el final del sexto mes, el beb mide aproximadamente 9 pulgadas (23 cm) y pesa alrededor de 1 libras (700 g). Es probable que sienta al beb moverse (dar pataditas) entre las 18 y 20 semanas del Psychiatrist. CUIDADOS EN EL HOGAR   No fume, no consuma hierbas ni beba alcohol. No tome frmacos que el mdico no haya autorizado.  No consuma ningn producto que contenga tabaco, lo que incluye cigarrillos, tabaco de Theatre manager o Administrator, Civil Service. Si necesita ayuda para dejar de fumar, consulte al American Express. Puede recibir asesoramiento u otro tipo de apoyo para dejar de fumar.  Tome los medicamentos solamente como se lo haya indicado el mdico. Algunos medicamentos son seguros para tomar durante el Psychiatrist y otros no lo son.  Haga ejercicios solamente como se lo haya indicado el mdico. Interrumpa la actividad fsica si comienza a tener calambres.  Ingiera alimentos saludables de [place] regular.  Use un sostn que le brinde buen soporte si sus mamas estn sensibles.  No se d baos de inmersin en agua caliente, baos turcos ni saunas.  Colquese el cinturn de seguridad cuando  conduzca.  No coma carne cruda ni queso sin cocinar; evite el contacto con las bandejas sanitarias de los gatos y la tierra que estos animales usan.  Tome las vitaminas prenatales.  Tome entre 1500 y  de calcio diariamente comenzando en la semana20 del embarazo [place].  Pruebe tomar un medicamento que la ayude a defecar (un laxante suave) si el mdico lo autoriza. Consuma ms fibra, que se encuentra en las frutas y verduras frescas y los cereales integrales. Beba suficiente lquido para mantener el pis (orina) claro o de color amarillo plido.  Dese baos de asiento con agua tibia para Engineer, materials o las molestias causadas por las hemorroides. Use una crema para las hemorroides si el mdico la autoriza.  Si se le hinchan las venas (venas varicosas), use medias de descanso. Levante (eleve) los pies durante , 3 o 4veces por Futures trader. Limite el consumo de sal en su dieta.  No levante objetos pesados, use zapatos de tacones bajos y sintese derecha.  Descanse con las piernas elevadas si tiene calambres o dolor de cintura.  Visite a su dentista si no lo ha Occupational hygienist. Use un cepillo de cerdas suaves para cepillarse los dientes. Psese el hilo dental con suavidad.  Puede seguir Sears Holdings Corporation  relaciones sexuales, a menos que el mdico le indique lo contrario.  Concurra a los controles mdicos. SOLICITE AYUDA SI:   Siente mareos.  Sufre calambres o presin leves en la parte baja del vientre (abdomen).  Sufre un dolor persistente en el abdomen.  Tiene Programme researcher, broadcasting/film/videomalestar estomacal (nuseas), vmitos, o tiene deposiciones acuosas (diarrea).  Advierte un olor ftido que proviene de la vagina.  Siente dolor al ConocoPhillipsorinar. SOLICITE AYUDA DE INMEDIATO SI:   Tiene fiebre.  Tiene una prdida de lquido por la vagina.  Tiene sangrado o pequeas prdidas vaginales.  Siente dolor intenso o clicos en el abdomen.  Sube o baja de peso rpidamente.  Tiene  dificultades para recuperar el aliento y siente dolor en el pecho.  Sbitamente se le hinchan mucho el rostro, las [place]manos, los tobillos, los pies o las piernas.  No ha sentido los movimientos del beb durante Georgianne Fickuna hora.  Siente un dolor de cabeza intenso que no se alivia con medicamentos.  Su visin se modifica.   Esta informacin no tiene Theme park managercomo fin reemplazar el consejo del mdico. Asegrese de hacerle al mdico cualquier pregunta que tenga.   Document Released: 12/06/2012 Document Revised: 04/26/2014 Elsevier Interactive Patient Education Yahoo! Inc2016 Elsevier Inc.

## 2015-06-24 LAB — CULTURE, OB URINE: Colony Count: 70000

## 2015-06-24 NOTE — Progress Notes (Signed)
[name] is a 35 y.o. [id number] at 1336w0d for routine follow up. She denies bleeding, vaginal discharge, loss of fluid, or contractions. Reports minimal nausea but denies vomiting. Denies symptoms of UTI, including increased urinary frequency, dysuria, hematuria, or vaginal discharge. Completed 7 day course of Keflex as prescribed at last visit. See flow sheet for details.  A/P:  Pregnancy at 6236w0d. Doing well with no concerns.  - Pregnancy issues: AMA, history of HSV - Preterm labor precautions reviewed. - Urine culture today for test of cure after Keflex treatment for UTI at last appointment - Plan to begin Valtrex at 36 weeks for history of HSV - Follow up in 4 weeks.

## 2015-07-14 ENCOUNTER — Other Ambulatory Visit: Payer: Self-pay | Admitting: Internal Medicine

## 2015-07-14 ENCOUNTER — Telehealth: Payer: Self-pay | Admitting: Internal Medicine

## 2015-07-14 ENCOUNTER — Encounter: Payer: Self-pay | Admitting: Internal Medicine

## 2015-07-14 DIAGNOSIS — O4402 Placenta previa specified as without hemorrhage, second trimester: Secondary | ICD-10-CM

## 2015-07-14 DIAGNOSIS — O2622 Pregnancy care for patient with recurrent pregnancy loss, second trimester: Secondary | ICD-10-CM

## 2015-07-14 DIAGNOSIS — O4400 Placenta previa specified as without hemorrhage, unspecified trimester: Secondary | ICD-10-CM | POA: Insufficient documentation

## 2015-07-14 NOTE — Telephone Encounter (Signed)
Patient's initial referral to OB high risk clinic declined. After discussion with Dr. Pollie MeyerMcIntyre, referred again, as patient has history of three first trimester ABs. Patient also with placenta previa noted on anatomy US. Will schedule f/u US for patient within the next 1-2 weeks to monitor.

## 2015-07-23 ENCOUNTER — Ambulatory Visit (HOSPITAL_COMMUNITY)
Admission: RE | Admit: 2015-07-23 | Discharge: 2015-07-23 | Disposition: A | Discharge status: Home / Self Care | Payer: Self-pay | Source: Ambulatory Visit | Attending: Family Medicine | Admitting: Family Medicine

## 2015-07-23 ENCOUNTER — Other Ambulatory Visit: Payer: Self-pay | Admitting: Internal Medicine

## 2015-07-23 DIAGNOSIS — O09293 Supervision of pregnancy with other poor reproductive or obstetric history, third trimester: Secondary | ICD-10-CM

## 2015-07-23 DIAGNOSIS — O09523 Supervision of elderly multigravida, third trimester: Secondary | ICD-10-CM

## 2015-07-23 DIAGNOSIS — O4402 Placenta previa specified as without hemorrhage, second trimester: Secondary | ICD-10-CM

## 2015-07-23 DIAGNOSIS — O4403 Placenta previa specified as without hemorrhage, third trimester: Secondary | ICD-10-CM | POA: Insufficient documentation

## 2015-07-23 DIAGNOSIS — Z3A30 30 weeks gestation of pregnancy: Secondary | ICD-10-CM

## 2015-07-25 ENCOUNTER — Encounter: Payer: Self-pay | Admitting: Internal Medicine

## 2015-07-25 ENCOUNTER — Ambulatory Visit (INDEPENDENT_AMBULATORY_CARE_PROVIDER_SITE_OTHER): Payer: Self-pay | Admitting: Internal Medicine

## 2015-07-25 VITALS — BP 100/60 | HR 85 | Temp 97.8°F | Wt 139.5 lb

## 2015-07-25 DIAGNOSIS — O0001 Abdominal pregnancy with intrauterine pregnancy: Secondary | ICD-10-CM

## 2015-07-25 LAB — CBC
HEMATOCRIT: 34.6 % — AB (ref 35.0–45.0)
Hemoglobin: 11.9 g/dL (ref 11.7–15.5)
MCH: 29.8 pg (ref 27.0–33.0)
MCHC: 34.4 g/dL (ref 32.0–36.0)
MCV: 86.5 fL (ref 80.0–100.0)
MPV: 10.6 fL (ref 7.5–12.5)
PLATELETS: 213 10*3/uL (ref 140–400)
RBC: 4 MIL/uL (ref 3.80–5.10)
RDW: 12.9 % (ref 11.0–15.0)
WBC: 9.4 10*3/uL (ref 3.8–10.8)

## 2015-07-25 NOTE — Progress Notes (Signed)
[name] is a 35 yo [id number] at 474w4d for routine follow-up. She denies bleeding, vaginal discharge, loss of fluid, or contractions. Her daughter was born via C-section, however she would like a VBAC for this delivery. Explained to patient that, given her history of CS, she will need to be seen by Dr. Shawnie PonsPratt, and can discuss plan for VBAC at this time.   A/P: Pregnancy at 894w4d. Doing well with no concerns.  - Pregnancy issues: AMA, history of HSV, history of prior Cesarean  - Preterm labor precautions reviewed.  - Repeat CBC, RPR, HIV today - Patient to schedule 1hr GTT soon - Patient to see Dr. Tinnie Gensanya Pratt to discuss prior C-section and desire for VBAC in two weeks - [facility] - Plan to begin Valtrex at 36 weeks for history of HSV

## 2015-07-25 NOTE — Patient Instructions (Addendum)
It was nice seeing you again today Ms. [name]!  Below you will find information on signs of preterm labor. If you begin to have abdominal pain, vaginal bleeding, regular contractions, or feel a loss of fluid, please [facility] the emergency [facility] [address], [place], [place] [id number]).   We will see you back in weeks for another appointment.   If you have any questions or concerns, feel free to call the clinic at any time.  Be well,  Dr. Natale MilchL[place]  Informacin sobre el parto prematuro  (Preterm Labor Information)  Se llama parto prematuro cuando se inicia antes de las 37 semanas de Tollesonembarazo. La duracin de un embarazo normal es de 39 a 41 semanas.  CAUSAS  Generalmente las causas del parto prematuro no se conocen. La causa ms frecuente conocida es una infeccin.  FACTORES DE RIESGO   Historia previa de parto prematuro.  Romper la bolsa de aguas antes de Whitesborotiempo.  La placenta cubre la abertura del cuello.  La placenta se despega del tero.  El cuello es demasiado dbil para contener al beb en el tero.  Hay mucho lquido en el saco amnitico.  Consumo de drogas o hbito de fumar durante el Longembarazo.  No aumentar de peso lo suficiente durante el Big Lotsembarazo.  Mujeres menores de 18 aos o mayores de [address] [place]35 aos.  Tener bajos ingresos.  Pertenecer a Engineer, productionla raza afroamericana. SNTOMAS   Clicos similares a los menstruales, dolor en el vientre (abdominal) o dolor en la espalda.  Contracciones regulares, tan frecuentes como seis en Marshall & Ilsleyuna hora. Pueden ser suaves o dolorosas.  Contracciones que comienzan en la parte superior del vientre. Luego bajan hacia la zona inferior del vientre y Hilton Hotelshacia la espalda.  Presin en la zona inferior del vientre que Advertising account executiveparece empeorar.  Sangrado que proviene de la vagina.  Prdida de lquido por la vagina. TRATAMIENTO  El tratamiento depende de:   Su estado.  El University Heightsestado del beb.  Cuntas semanas tiene de Connerembarazo. El  mdico podr indicarle:   Medicamentos para Print production plannerdetener las contracciones.  Que permanezca en la cama excepto para ir al bao (reposo en cama).  Que permanezca en el hospital. QU DEBE HACER SI PIENSA QUE EST EN TRABAJO DE PARTO PREMATURO?  Comunquese con su mdico de inmediato. Debe concurrir al hospital para ser controlada inmediatamente.  CMO PUEDE EVITAR EL TRABAJO DE PARTO PREMATURO EN FUTUROS EMBARAZOS?   Si fuma, abandone el hbito.  Mantenga un aumento de peso saludable.  Notome drogas ni manipule sustancias qumicas que no necesita.  Informe a su mdico si piensa que tiene una infeccin.  Informe a su mdico si tuvo un trabajo de parto prematuro anteriormente.   Esta informacin no tiene Theme park managercomo fin reemplazar el consejo del mdico. Asegrese de hacerle al mdico cualquier pregunta que tenga.   Document Released: 05/08/2010 Document Revised: 12/06/2012 Elsevier Interactive Patient Education Yahoo! Inc2016 Elsevier Inc.

## 2015-07-26 ENCOUNTER — Inpatient Hospital Stay (HOSPITAL_COMMUNITY)
Admission: AD | Admit: 2015-07-26 | Discharge: 2015-08-02 | DRG: 781 | Disposition: A | Discharge status: Home / Self Care | Payer: Self-pay | Source: Ambulatory Visit | Attending: Obstetrics & Gynecology | Admitting: Obstetrics & Gynecology

## 2015-07-26 ENCOUNTER — Encounter (HOSPITAL_COMMUNITY): Payer: Self-pay | Admitting: Certified Nurse Midwife

## 2015-07-26 ENCOUNTER — Inpatient Hospital Stay (HOSPITAL_COMMUNITY): Payer: Self-pay

## 2015-07-26 DIAGNOSIS — E039 Hypothyroidism, unspecified: Secondary | ICD-10-CM

## 2015-07-26 DIAGNOSIS — O99283 Endocrine, nutritional and metabolic diseases complicating pregnancy, third trimester: Secondary | ICD-10-CM

## 2015-07-26 DIAGNOSIS — O34219 Maternal care for unspecified type scar from previous cesarean delivery: Secondary | ICD-10-CM

## 2015-07-26 DIAGNOSIS — Z3A30 30 weeks gestation of pregnancy: Secondary | ICD-10-CM

## 2015-07-26 DIAGNOSIS — Z87891 Personal history of nicotine dependence: Secondary | ICD-10-CM

## 2015-07-26 DIAGNOSIS — O4593 Premature separation of placenta, unspecified, third trimester: Principal | ICD-10-CM | POA: Diagnosis present

## 2015-07-26 DIAGNOSIS — O4690 Antepartum hemorrhage, unspecified, unspecified trimester: Secondary | ICD-10-CM | POA: Diagnosis present

## 2015-07-26 DIAGNOSIS — O09299 Supervision of pregnancy with other poor reproductive or obstetric history, unspecified trimester: Secondary | ICD-10-CM

## 2015-07-26 DIAGNOSIS — O09523 Supervision of elderly multigravida, third trimester: Secondary | ICD-10-CM

## 2015-07-26 DIAGNOSIS — O4693 Antepartum hemorrhage, unspecified, third trimester: Secondary | ICD-10-CM | POA: Diagnosis present

## 2015-07-26 HISTORY — DX: Mental disorder, not otherwise specified: F99

## 2015-07-26 HISTORY — DX: Herpesviral infection, unspecified: B00.9

## 2015-07-26 LAB — URINALYSIS, ROUTINE W REFLEX MICROSCOPIC
Bilirubin Urine: NEGATIVE
GLUCOSE, UA: NEGATIVE mg/dL
KETONES UR: NEGATIVE mg/dL
LEUKOCYTES UA: NEGATIVE
NITRITE: NEGATIVE
PH: 6 (ref 5.0–8.0)
Protein, ur: NEGATIVE mg/dL
SPECIFIC GRAVITY, URINE: 1.015 (ref 1.005–1.030)

## 2015-07-26 LAB — CBC
HCT: 34.1 % — ABNORMAL LOW (ref 36.0–46.0)
Hemoglobin: 12 g/dL (ref 12.0–15.0)
MCH: 30.1 pg (ref 26.0–34.0)
MCHC: 35.2 g/dL (ref 30.0–36.0)
MCV: 85.5 fL (ref 78.0–100.0)
PLATELETS: 204 10*3/uL (ref 150–400)
RBC: 3.99 MIL/uL (ref 3.87–5.11)
RDW: 12.7 % (ref 11.5–15.5)
WBC: 10.2 10*3/uL (ref 4.0–10.5)

## 2015-07-26 LAB — WET PREP, GENITAL
CLUE CELLS WET PREP: NONE SEEN
SPERM: NONE SEEN
TRICH WET PREP: NONE SEEN
Yeast Wet Prep HPF POC: NONE SEEN

## 2015-07-26 LAB — URINE MICROSCOPIC-ADD ON

## 2015-07-26 LAB — HIV ANTIBODY (ROUTINE TESTING W REFLEX): HIV: NONREACTIVE

## 2015-07-26 LAB — OB RESULTS CONSOLE GBS: GBS: POSITIVE

## 2015-07-26 LAB — TYPE AND SCREEN
ABO/RH(D): O POS
ANTIBODY SCREEN: NEGATIVE

## 2015-07-26 LAB — RPR

## 2015-07-26 MED ORDER — SODIUM CHLORIDE 0.9% FLUSH
3.0000 mL | Freq: Two times a day (BID) | INTRAVENOUS | Status: DC
  Administered 2015-07-28 – 2015-08-01 (×9): 3 mL via INTRAVENOUS

## 2015-07-26 MED ORDER — ACYCLOVIR 400 MG PO TABS
400.0000 mg | ORAL_TABLET | Freq: Three times a day (TID) | ORAL | Status: DC
  Administered 2015-07-26 – 2015-08-01 (×19): 400 mg via ORAL
  Filled 2015-07-26 (×20): qty 1

## 2015-07-26 MED ORDER — ZOLPIDEM TARTRATE 5 MG PO TABS: 5.0000 mg | ORAL_TABLET | Freq: Every evening | ORAL | Status: DC | PRN

## 2015-07-26 MED ORDER — BETAMETHASONE SOD PHOS & ACET 6 (3-3) MG/ML IJ SUSP
12.0000 mg | INTRAMUSCULAR | Status: AC
  Administered 2015-07-26 – 2015-07-27 (×2): 12 mg via INTRAMUSCULAR
  Filled 2015-07-26 (×2): qty 2

## 2015-07-26 MED ORDER — MAGNESIUM SULFATE BOLUS VIA INFUSION
4.0000 g | Freq: Once | INTRAVENOUS | Status: AC
  Administered 2015-07-26: 4 g via INTRAVENOUS
  Filled 2015-07-26: qty 500

## 2015-07-26 MED ORDER — DOCUSATE SODIUM 100 MG PO CAPS
100.0000 mg | ORAL_CAPSULE | Freq: Every day | ORAL | Status: DC
  Administered 2015-07-28 – 2015-08-01 (×5): 100 mg via ORAL
  Filled 2015-07-26 (×6): qty 1

## 2015-07-26 MED ORDER — HYDRALAZINE HCL 20 MG/ML IJ SOLN: 10.0000 mg | Freq: Once | INTRAMUSCULAR | Status: DC | PRN

## 2015-07-26 MED ORDER — LACTATED RINGERS IV SOLN
INTRAVENOUS | Status: DC
  Administered 2015-07-26 – 2015-07-27 (×2): via INTRAVENOUS

## 2015-07-26 MED ORDER — LABETALOL HCL 5 MG/ML IV SOLN: 20.0000 mg | INTRAVENOUS | Status: DC | PRN

## 2015-07-26 MED ORDER — ACETAMINOPHEN 325 MG PO TABS: 650.0000 mg | ORAL_TABLET | ORAL | Status: DC | PRN

## 2015-07-26 MED ORDER — PRENATAL MULTIVITAMIN CH
1.0000 | ORAL_TABLET | Freq: Every day | ORAL | Status: DC
  Administered 2015-07-26 – 2015-08-01 (×7): 1 via ORAL
  Filled 2015-07-26 (×7): qty 1

## 2015-07-26 MED ORDER — CALCIUM CARBONATE ANTACID 500 MG PO CHEW: 2.0000 | CHEWABLE_TABLET | ORAL | Status: DC | PRN

## 2015-07-26 MED ORDER — SODIUM CHLORIDE 0.9 % IV SOLN
250.0000 mL | INTRAVENOUS | Status: DC | PRN
  Administered 2015-07-26: 1000 mL via INTRAVENOUS

## 2015-07-26 MED ORDER — MAGNESIUM SULFATE 50 % IJ SOLN
1.0000 g/h | INTRAVENOUS | Status: AC
  Administered 2015-07-26: 1 g/h via INTRAVENOUS
  Filled 2015-07-26: qty 80

## 2015-07-26 MED ORDER — SODIUM CHLORIDE 0.9% FLUSH: 3.0000 mL | INTRAVENOUS | Status: DC | PRN

## 2015-07-26 NOTE — Progress Notes (Signed)
CSW received call from [facility]U RN stating consult for no child care and need to be admitted.  CSW cannot assist with child care and asked this be explained to patient.  Patient can determine if there is someone to watch her child while she is in the hospital or if she refuses admission due to lack of child care.

## 2015-07-26 NOTE — MAU Provider Note (Signed)
ANTEPARTUM ADMISSION HISTORY AND PHYSICAL NOTE   History of Present Illness: [name] is a 35 y.o. [id number] at [redacted]w[redacted]d presenting w/ vaginal bleeding.  Began this morning. First time this pregnancy. Hx previa resolved on u/s 2 days ago. Nothing in vagina last 72 hours. No abdominal or vaginal pain. No dysuria or discharge. No fever, no n/v/d. Otherwise feeling well. Bleeding is less than a regular period bleeding.  Patient Active Problem List   Diagnosis Date Noted  . H/O cesarean section 07/26/2015  . Hx of preeclampsia, prior pregnancy, currently pregnant 07/26/2015  . Vaginal bleeding during pregnancy, antepartum 07/26/2015  . Placenta previa 07/14/2015  . High-risk pregnancy 05/27/2015  . UTI (urinary tract infection) during pregnancy 05/27/2015  . Irregular intermenstrual bleeding 09/17/2011  . Contraception management 11/10/2010  . Hypothyroid 09/16/2010  . History of herpes genitalis 11/26/2009    Past Medical History  Diagnosis Date  . Thyroid disease     hypothyroid  . Hypothyroidism   . Mental disorder     Post partum depression/anxiety  . Herpes simplex     diagnosed in [place], no recent breakouts    Past Surgical History  Procedure Laterality Date  . Cholecystectomy    . Cesarean section      OB History  Gravida Para Term Preterm AB SAB TAB Ectopic Multiple Living  0 0 0 1    # Outcome Date GA Lbr Len/2nd Weight Sex Delivery Anes PTL Lv  6 Current           5 TAB           4 SAB           3 SAB           2 SAB           1 Term             Obstetric Comments  3 spontaneous abortions all at [redacted] weeks gestation  Last pregnancy 5 years ago (daughter born at 45 weeks)    Social History   Social History  . Marital Status: Single    Spouse Name: N/A  . Number of Children: N/A  . Years of Education: N/A   Social History Main Topics  . Smoking status: Former Smoker -- 0.75 packs/day for .1 years    Types: Cigarettes  . Smokeless  tobacco: Never Used  . Alcohol Use: No  . Drug Use: No  . Sexual Activity: No   Other Topics Concern  . None   Social History Narrative    History reviewed. No pertinent family history.  No Known Allergies  Prescriptions prior to admission  Medication Sig Dispense Refill Last Dose  . folic acid (FOLVITE) 1 MG tablet Take 1 mg by mouth daily.   07/25/2015 at Unknown time  . Prenatal Vit-Fe Fumarate-FA (PRENATAL MULTIVITAMIN) TABS tablet Take 1 tablet by mouth daily at 12 noon.   07/25/2015 at Unknown time  . levothyroxine (SYNTHROID, LEVOTHROID) 100 MCG tablet Take 1 tablet (100 mcg total) by mouth daily. (Patient not taking: Reported on 07/26/2015) 90 tablet 2 Not Taking    Review of Systems - Negative except as per hpi  Vitals:  BP 110/74 mmHg  Pulse 83  Temp(Src) 98.4 F (36.9 C) (Oral)  Resp 20  Ht  (1.549 m)  Wt 141 lb (63.957 kg)  BMI 26.66 kg/m2  LMP 12/23/2014 Physical Examination: CONSTITUTIONAL: Well-developed, well-nourished female in no acute distress.  HENT:  Normocephalic, atraumatic, External right and left ear normal. Oropharynx is clear and moist EYES: Conjunctivae and EOM are normal. Pupils are equal, round, and reactive to light. No scleral icterus.  NECK: Normal range of motion, supple, no masses SKIN: Skin is warm and dry. No rash noted. Not diaphoretic. No erythema. No pallor. NEUROLGIC: Alert and oriented to person, place, and time. Normal reflexes, muscle tone coordination. No cranial nerve deficit noted. PSYCHIATRIC: Normal mood and affect. Normal behavior. Normal judgment and thought content. CARDIOVASCULAR: Normal heart rate noted, regular rhythm RESPIRATORY: Effort and breath sounds normal, no problems with respiration noted ABDOMEN: Soft, nontender, nondistended, gravid. MUSCULOSKELETAL: Normal range of motion. No edema and no tenderness. 2+ distal pulses.  Cervix: visually closed. Mild/moderate amount dark red blood posterior fornix and  blood-tinged mucous extending from os. Normal appearing cervix, no lesions.  Labs:  Results for orders placed or performed during the hospital encounter of 07/26/15 (from the past 24 hour(s))  Urinalysis, Routine w reflex microscopic (not at [facility])   Collection Time: 07/26/15 11:33 AM  Result Value Ref Range   Color, Urine YELLOW YELLOW   APPearance CLEAR CLEAR   Specific Gravity, Urine 1.015 1.005 - 1.030   pH 6.0 5.0 - 8.0   Glucose, UA NEGATIVE NEGATIVE mg/dL   Hgb urine dipstick LARGE (A) NEGATIVE   Bilirubin Urine NEGATIVE NEGATIVE   Ketones, ur NEGATIVE NEGATIVE mg/dL   Protein, ur NEGATIVE NEGATIVE mg/dL   Nitrite NEGATIVE NEGATIVE   Leukocytes, UA NEGATIVE NEGATIVE  Urine microscopic-add on   Collection Time: 07/26/15 11:33 AM  Result Value Ref Range   Squamous Epithelial / LPF 0-5 (A) NONE SEEN   WBC, UA 0-5 0 - 5 WBC/hpf   RBC / HPF 0-5 0 - 5 RBC/hpf   Bacteria, UA FEW (A) NONE SEEN  Wet prep, genital   Collection Time: 07/26/15 12:23 PM  Result Value Ref Range   Yeast Wet Prep HPF POC NONE SEEN NONE SEEN   Trich, Wet Prep NONE SEEN NONE SEEN   Clue Cells Wet Prep HPF POC NONE SEEN NONE SEEN   WBC, Wet Prep HPF POC FEW (A) NONE SEEN   Sperm NONE SEEN   Results for orders placed or performed in visit on 07/25/15 (from the past 24 hour(s))  HIV antibody   Collection Time: 07/25/15  2:48 PM  Result Value Ref Range   HIV 1&2 Ab, 4th Generation NONREACTIVE NONREACTIVE  RPR   Collection Time: 07/25/15  2:48 PM  Result Value Ref Range   RPR Ser Ql NON REAC NON REAC  CBC   Collection Time: 07/25/15  2:48 PM  Result Value Ref Range   WBC 9.4 3.8 - 10.8 K/uL   RBC 4.00 3.80 - 5.10 MIL/uL   Hemoglobin 11.9 11.7 - 15.5 g/dL   HCT 09.834.6 (L) 11.935.0 - 14.745.0 %   MCV 86.5 80.0 - 100.0 fL   MCH 29.8 27.0 - 33.0 pg   MCHC 34.4 32.0 - 36.0 g/dL   RDW 82.912.9 56.211.0 - 13.015.0 %   Platelets 213 140 - 400 K/uL   MPV 10.6 7.5 - 12.5 fL    Imaging Studies: [place] [facility] Ob  Transvaginal  07/24/2015  OBSTETRICAL ULTRASOUND: This exam was performed within a [facility]. The OB US report was generated in the AS system, and faxed to the ordering physician.  This report is available in the [employer] PACS. See the AS Obstetric US report via the Image Link.  [place] [facility] Ob Follow Up  07/24/2015  OBSTETRICAL ULTRASOUND: This exam was performed within a [facility]. The OB US report was generated in the AS system, and faxed to the ordering physician.  This report is available in the [employer]. See the [facility] Obstetric US report via the Image Link.    Assessment and Plan: Patient Active Problem List   Diagnosis Date Noted  . H/O cesarean section 07/26/2015  . Hx of preeclampsia, prior pregnancy, currently pregnant 07/26/2015  . Vaginal bleeding during pregnancy, antepartum 07/26/2015  . Placenta previa 07/14/2015  . High-risk pregnancy 05/27/2015  . UTI (urinary tract infection) during pregnancy 05/27/2015  . Irregular intermenstrual bleeding 09/17/2011  . Contraception management 11/10/2010  . Hypothyroid 09/16/2010  . History of herpes genitalis 11/26/2009   34 yo [id number] @ 30+5 here with painless vaginal bleeding  #Vaginal bleeding - concern for mild abruption. Hx previa this pregnancy but resolved on u/s 2 days ago. NST reactive, abdomen non-tender. UA not suggestive of infection and does not have other symptoms uti. Wet prep negative. G/C is pending. - repeat u/s ordered - BMZ now and in 24 hours - admit for observation - f/u gbs culture  Admit to Antenatal Routine antenatal care  Silvano Bilis, MD Ob/gyn fellow Faculty Practice, [facility]

## 2015-07-26 NOTE — MAU Note (Signed)
Pt states she has brown/red discharge since 10AM this morning. Pt denies recent intercourse. Pt denies ctxs or leaking of fluid. Pt states +FM.

## 2015-07-27 ENCOUNTER — Encounter (HOSPITAL_COMMUNITY): Payer: Self-pay

## 2015-07-27 DIAGNOSIS — O4693 Antepartum hemorrhage, unspecified, third trimester: Secondary | ICD-10-CM | POA: Diagnosis present

## 2015-07-27 DIAGNOSIS — Z3A30 30 weeks gestation of pregnancy: Secondary | ICD-10-CM

## 2015-07-27 LAB — CULTURE, BETA STREP (GROUP B ONLY)

## 2015-07-27 LAB — RPR: RPR Ser Ql: NONREACTIVE

## 2015-07-27 LAB — HIV ANTIBODY (ROUTINE TESTING W REFLEX): HIV Screen 4th Generation wRfx: NONREACTIVE

## 2015-07-27 NOTE — Progress Notes (Signed)
FACULTY PRACTICE ANTEPARTUM(COMPREHENSIVE) NOTE  [name] is a 35 y.o. [id number] at 6055w6d by LMP who is admitted for vaginal bleeding.   Fetal presentation is cephalic. Length of Stay:  1  Days  Subjective: No bleeding Patient reports the fetal movement as active. Patient reports uterine contraction  activity as none. Patient reports  vaginal bleeding as none. Patient describes fluid per vagina as None.  Vitals:  Blood pressure 103/59, pulse 98, temperature 98.3 F (36.8 C), temperature source Oral, resp. rate 18, height 5\' 1"  (1.549 m), weight 63.957 kg (141 lb), last menstrual period 12/23/2014, SpO2 97 %. Physical Examination:  General appearance - alert, well appearing, and in no distress Heart - normal rate and regular rhythm Abdomen - soft, nontender, nondistended Fundal Height:  size equals dates Cervical Exam: Not evaluated. Membranes:intact  Fetal Monitoring:     Fetal Heart Rate A      Mode  External filed at 07/26/2015 2000    Baseline Rate (A)  135 bpm filed at 07/27/2015 0200    Variability  6-25 BPM filed at 07/27/2015 0200    Accelerations  10 x 10 filed at 07/27/2015 0200    Decelerations  None filed at 07/27/2015 0200      Labs:  Results for orders placed or performed during the hospital encounter of 07/26/15 (from the past 24 hour(s))  Urinalysis, Routine w reflex microscopic (not at [facility])   Collection Time: 07/26/15 11:33 AM  Result Value Ref Range   Color, Urine YELLOW YELLOW   APPearance CLEAR CLEAR   Specific Gravity, Urine 1.015 1.005 - 1.030   pH 6.0 5.0 - 8.0   Glucose, UA NEGATIVE NEGATIVE mg/dL   Hgb urine dipstick LARGE (A) NEGATIVE   Bilirubin Urine NEGATIVE NEGATIVE   Ketones, ur NEGATIVE NEGATIVE mg/dL   Protein, ur NEGATIVE NEGATIVE mg/dL   Nitrite NEGATIVE NEGATIVE   Leukocytes, UA NEGATIVE NEGATIVE  Urine microscopic-add on   Collection Time: 07/26/15 11:33 AM  Result Value Ref Range   Squamous Epithelial / LPF  0-5 (A) NONE SEEN   WBC, UA 0-5 0 - 5 WBC/hpf   RBC / HPF 0-5 0 - 5 RBC/hpf   Bacteria, UA FEW (A) NONE SEEN  Wet prep, genital   Collection Time: 07/26/15 12:23 PM  Result Value Ref Range   Yeast Wet Prep HPF POC NONE SEEN NONE SEEN   Trich, Wet Prep NONE SEEN NONE SEEN   Clue Cells Wet Prep HPF POC NONE SEEN NONE SEEN   WBC, Wet Prep HPF POC FEW (A) NONE SEEN   Sperm NONE SEEN   CBC on admission   Collection Time: 07/26/15  1:53 PM  Result Value Ref Range   WBC 10.2 4.0 - 10.5 K/uL   RBC 3.99 3.87 - 5.11 MIL/uL   Hemoglobin 12.0 12.0 - 15.0 g/dL   HCT 63.834.1 (L) 75.636.0 - 43.346.0 %   MCV 85.5 78.0 - 100.0 fL   MCH 30.1 26.0 - 34.0 pg   MCHC 35.2 30.0 - 36.0 g/dL   RDW 29.512.7 18.811.5 - 41.615.5 %   Platelets 204 150 - 400 K/uL  RPR   Collection Time: 07/26/15  1:53 PM  Result Value Ref Range   RPR Ser Ql TEST REQUEST RECEIVED WITHOUT APPROPRIATE SPECIMEN   Type and screen [facility]   Collection Time: 07/26/15  1:53 PM  Result Value Ref Range   ABO/RH(D) O POS    Antibody Screen NEG    Sample Expiration  07/29/2015     Imaging Studies:     Currently EPIC will not allow sonographic studies to automatically populate into notes.  In the meantime, copy and paste results into note or free text.  Medications:  Scheduled . acyclovir  400 mg Oral TID  . betamethasone acetate-betamethasone sodium phosphate  12 mg Intramuscular Q24H  . docusate sodium  100 mg Oral Daily  . prenatal multivitamin  1 tablet Oral Q1200  . sodium chloride flush  3 mL Intravenous Q12H   I have reviewed the patient's current medications.  ASSESSMENT: Patient Active Problem List   Diagnosis Date Noted  . Third trimester bleeding, antepartum 07/27/2015  . H/O cesarean section 07/26/2015  . Hx of preeclampsia, prior pregnancy, currently pregnant 07/26/2015  . Vaginal bleeding during pregnancy, antepartum 07/26/2015  . High-risk pregnancy 05/27/2015  . UTI (urinary tract infection) during  pregnancy 05/27/2015  . Irregular intermenstrual bleeding 09/17/2011  . Hypothyroid 09/16/2010  . History of herpes genitalis 11/26/2009    PLAN: Magnesium for 12 hrs then d/c Betamethasone Observation for recurrent bleeding  [name] 07/27/2015,6:49 AM

## 2015-07-28 ENCOUNTER — Encounter (HOSPITAL_COMMUNITY): Payer: Self-pay | Admitting: *Deleted

## 2015-07-28 LAB — GC/CHLAMYDIA PROBE AMP (~~LOC~~) NOT AT ARMC
Chlamydia: NEGATIVE
Neisseria Gonorrhea: NEGATIVE

## 2015-07-28 NOTE — Progress Notes (Signed)
Patient ID: [name], female   DOB: 1980/09/27, 35 y.o.   MRN: [id number] FACULTY PRACTICE ANTEPARTUM(COMPREHENSIVE) NOTE  [name] is a 35 y.o. [id number] at 5229w0d  by LMP who is admitted for vaginal bleeding.   Fetal presentation is cephalic. Length of Stay:  2  Days  Subjective: No bleeding Patient reports the fetal movement as active. Patient reports uterine contraction  activity as none. Patient reports  vaginal bleeding as none. Patient describes fluid per vagina as None.  Vitals:  Blood pressure 102/52, pulse 72, temperature 97.8 F (36.6 C), temperature source Oral, resp. rate 18, height 5\' 1"  (1.549 m), weight 141 lb (63.957 kg), last menstrual period 12/23/2014, SpO2 97 %. Physical Examination:  General appearance - alert, well appearing, and in no distress Heart - normal rate and regular rhythm Abdomen - soft, nontender, nondistended Fundal Height:  size equals dates Cervical Exam: Not evaluated. Membranes:intact  Fetal Monitoring:     Fetal Heart Rate A      Mode  External filed at 07/26/2015 2000    Baseline Rate (A)  135 bpm filed at 07/27/2015 0200    Variability  6-25 BPM filed at 07/27/2015 0200    Accelerations  10 x 10 filed at 07/27/2015 0200    Decelerations  None filed at 07/27/2015 0200      Labs:  No results found for this or any previous visit (from the past 24 hour(s)).  Imaging Studies:     Currently EPIC will not allow sonographic studies to automatically populate into notes.  In the meantime, copy and paste results into note or free text.  Medications:  Scheduled . acyclovir  400 mg Oral TID  . docusate sodium  100 mg Oral Daily  . prenatal multivitamin  1 tablet Oral Q1200  . sodium chloride flush  3 mL Intravenous Q12H   I have reviewed the patient's current medications.  ASSESSMENT: Patient Active Problem List   Diagnosis Date Noted  . Third trimester bleeding, antepartum 07/27/2015  . H/O cesarean section  07/26/2015  . Hx of preeclampsia, prior pregnancy, currently pregnant 07/26/2015  . Vaginal bleeding during pregnancy, antepartum 07/26/2015  . High-risk pregnancy 05/27/2015  . UTI (urinary tract infection) during pregnancy 05/27/2015  . Irregular intermenstrual bleeding 09/17/2011  . Hypothyroid 09/16/2010  . History of herpes genitalis 11/26/2009    PLAN: S/P Magnesium  Betamethasone x 2 Observation for recurrent bleeding  Braeden Dolinski H 07/28/2015,8:51 AM

## 2015-07-28 NOTE — Progress Notes (Signed)
Transferred to 154 via WC without event.

## 2015-07-29 NOTE — Progress Notes (Signed)
Hospital Day 3 Subjective:  [name] is a 35 y.o. [phone] 3210w1d who presented to MAU with vaginal bleeding morning of 4/8, admitted for observation for possible abruption.  No acute events overnight.  States she feels baby moving normal amount, no other discharge noted. Pt denies fevers, chills, abdominal pain, pelvic pain, nausea, vomiting.   Objective: Blood pressure 93/48, pulse 70, temperature 98.5 F (36.9 C), temperature source Oral, resp. rate 16, height 5\' 1"  (1.549 m), weight 63.957 kg (141 lb), last menstrual period 12/23/2014, SpO2 97 %.  Physical Exam:  General: alert, cooperative and no distress Lochia:normal flow Chest: CTAB Heart: RRR no m/r/g Abdomen: +BS, soft, nontender Fundal height: size matches dates Cervix: not evaluated  Recent Labs  07/26/15 1353  HGB 12.0  HCT 34.1*    Assessment/Plan:  ASSESSMENT: [name] is a 35 y.o. [id number] 1010w1d here for vaginal bleeding 3 days ago, admitted for possible placental abruption. No complaints since Saturday, baby currently on monitor and tracings normal.   PLAN: Will reduce continuous fetal monitoring to twice daily, once a shift.   LOS: 3 days   Kierstin January H [place] 07/29/2015, 8:00 AM

## 2015-07-29 NOTE — Progress Notes (Signed)
Patient ID: [name], female   DOB: 03/05/1981, 35 y.o.   MRN: [id number]  FACULTY PRACTICE ANTEPARTUM NOTE  [name] is a 35 y.o. [id number] at 976w1d  who is admitted for vaginal bleeding.   Fetal presentation is cephalic. Length of Stay:  3  Days  Subjective: Last bleed 4/8. No active bleeding, no vaginal discharge.  No other complaints. Patient reports good fetal movement.   She reports no uterine contractions She reports no bleeding  She reports no loss of fluid per vagina.  Vitals:  Blood pressure 93/48, pulse 70, temperature 98.5 F (36.9 C), temperature source Oral, resp. rate 16, height 5\' 1"  (1.549 m), weight 141 lb (63.957 kg), last menstrual period 12/23/2014, SpO2 97 %. Physical Examination:  General appearance - alert, well appearing, and in no distress Chest - non labored breathing Abdomen - soft, nontender, nondistended, no masses or organomegaly Fundal Height:  size equals dates Extremities: extremities normal, atraumatic, no cyanosis or edema  Membranes:intact  Fetal Monitoring:  Baseline: 140 bpm, Variability: Good {> 6 bpm), Accelerations: Reactive and Decelerations: Absent  Labs:  No results found for this or any previous visit (from the past 24 hour(s)).  Imaging Studies:       Medications:  Scheduled . acyclovir  400 mg Oral TID  . docusate sodium  100 mg Oral Daily  . prenatal multivitamin  1 tablet Oral Q1200  . sodium chloride flush  3 mL Intravenous Q12H   I have reviewed the patient's current medications.  ASSESSMENT: Patient Active Problem List   Diagnosis Date Noted  . Third trimester bleeding, antepartum 07/27/2015  . H/O cesarean section 07/26/2015  . Hx of preeclampsia, prior pregnancy, currently pregnant 07/26/2015  . Vaginal bleeding during pregnancy, antepartum 07/26/2015  . High-risk pregnancy 05/27/2015  . UTI (urinary tract infection) during pregnancy 05/27/2015  . Irregular intermenstrual bleeding 09/17/2011  .  Hypothyroid 09/16/2010  . History of herpes genitalis 11/26/2009    PLAN: 1.  Third Trimester bleeding.  S/p BMZ  No active bleeding.  Change fetal monitoring to NST twice daily.  Discharge when no bleeding x7 days.   Continue routine antenatal care.   Levie HeritageJacob J Quentyn Kolbeck, DO 07/29/2015,11:41 AM

## 2015-07-30 NOTE — Progress Notes (Signed)
Patient ID: [name], female   DOB: 1980/09/06, 35 y.o.   MRN: [id number]  FACULTY PRACTICE ANTEPARTUM NOTE  [name] is a 35 y.o. [id number] at 8218w1d  who is admitted for vaginal bleeding.   Fetal presentation is cephalic. Length of Stay:  4  Days  Subjective: Last bleed 4/8. No active bleeding, no vaginal discharge.  No other complaints. Patient reports good fetal movement.   She reports no uterine contractions She reports no bleeding  She reports no loss of fluid per vagina.  Vitals:  Blood pressure 83/47, pulse 75, temperature 98.6 F (37 C), temperature source Oral, resp. rate 16, height 5\' 1"  (1.549 m), weight 141 lb (63.957 kg), last menstrual period 12/23/2014, SpO2 97 %. Physical Examination:  General appearance - alert, well appearing, and in no distress Chest - non labored breathing Abdomen - soft, nontender, nondistended, no masses or organomegaly Fundal Height:  size equals dates Extremities: extremities normal, atraumatic, no cyanosis or edema  Membranes:intact  Fetal Monitoring:  Baseline: 140 bpm, Variability: Good {> 6 bpm), Accelerations: Reactive and Decelerations: Absent  Labs:  No results found for this or any previous visit (from the past 24 hour(s)).  Imaging Studies:       Medications:  Scheduled . acyclovir  400 mg Oral TID  . docusate sodium  100 mg Oral Daily  . prenatal multivitamin  1 tablet Oral Q1200  . sodium chloride flush  3 mL Intravenous Q12H   I have reviewed the patient's current medications.  ASSESSMENT: Patient Active Problem List   Diagnosis Date Noted  . Third trimester bleeding, antepartum 07/27/2015  . H/O cesarean section 07/26/2015  . Hx of preeclampsia, prior pregnancy, currently pregnant 07/26/2015  . Vaginal bleeding during pregnancy, antepartum 07/26/2015  . High-risk pregnancy 05/27/2015  . UTI (urinary tract infection) during pregnancy 05/27/2015  . Irregular intermenstrual bleeding 09/17/2011  .  Hypothyroid 09/16/2010  . History of herpes genitalis 11/26/2009    PLAN: 1.  Third Trimester bleeding.  S/p BMZ  No active bleeding.  Change fetal monitoring to NST twice daily.  Discharge when no bleeding x7 days.  Continue routine antenatal care.   Levie HeritageJacob J Lynell Greenhouse, DO 07/30/2015,7:16 AM

## 2015-07-31 ENCOUNTER — Other Ambulatory Visit: Payer: Self-pay

## 2015-07-31 NOTE — Progress Notes (Signed)
Patient ID: [name], female   DOB: 1980/05/28, 35 y.o.   MRN: [id number] FACULTY PRACTICE ANTEPARTUM(COMPREHENSIVE) NOTE  [name] is a 35 y.o. [id number] at 7584w3d who is admitted for bleeding, marginal abruption, now resolved bleeding, and without bleeding since day of admission last saturday.   Fetal presentation is unsure. Cephalic at last u/s Length of Stay:  5  Days  Subjective: Pt comfortable denies bleeding or contractions Patient reports the fetal movement as active. Patient reports uterine contraction  activity as none. Patient reports  vaginal bleeding as none. Patient describes fluid per vagina as None.  Vitals:  Blood pressure 95/62, pulse 71, temperature 98.2 F (36.8 C), temperature source Oral, resp. rate 16, height 5\' 1"  (1.549 m), weight 62.823 kg (138 lb 8 oz), last menstrual period 12/23/2014, SpO2 97 %. Physical Examination:  General appearance - alert, well appearing, and in no distress, oriented to person, place, and time and normal appearing weight Heart - normal rate and regular rhythm Abdomen - soft, nontender, nondistended Fundal Height:  size equals dates Cervical Exam: Not evaluated. and f and fetal presentation is cephalic. Extremities: extremities normal, atraumatic, no cyanosis or edema and Homans sign is negative, no sign of DVT with DTRs 2+ bilaterally Membranes:intact  Fetal Monitoring:  Daily monitoring   Labs:  No results found for this or any previous visit (from the past 24 hour(s)).  Imaging Studies:     Currently EPIC will not allow sonographic studies to automatically populate into notes.  In the meantime, copy and paste results into note or free text.  Medications:  Scheduled . acyclovir  400 mg Oral TID  . docusate sodium  100 mg Oral Daily  . prenatal multivitamin  1 tablet Oral Q1200  . sodium chloride flush  3 mL Intravenous Q12H   I have reviewed the patient's current medications.  ASSESSMENT: Patient Active  Problem List   Diagnosis Date Noted  . Third trimester bleeding, antepartum 07/27/2015  . H/O cesarean section 07/26/2015  . Hx of preeclampsia, prior pregnancy, currently pregnant 07/26/2015  . Vaginal bleeding during pregnancy, antepartum 07/26/2015  . High-risk pregnancy 05/27/2015  . UTI (urinary tract infection) during pregnancy 05/27/2015  . Irregular intermenstrual bleeding 09/17/2011  . Hypothyroid 09/16/2010  . History of herpes genitalis 11/26/2009    PLAN: Continue bedrest as inpatient til d/c Saturday, day 7  Merlyn Bollen V 07/31/2015,7:56 AM

## 2015-08-01 DIAGNOSIS — Z3A31 31 weeks gestation of pregnancy: Secondary | ICD-10-CM

## 2015-08-01 NOTE — Progress Notes (Signed)
Patient ID: [name], female   DOB: 06-20-1980, 35 y.o.   MRN: [id number] FACULTY PRACTICE ANTEPARTUM(COMPREHENSIVE) NOTE  [name] is a 35 y.o. [id number] at 4464w4d who is admitted for bleeding, marginal abruption, now resolved bleeding, and without bleeding since day of admission last saturday.   Fetal presentation is unsure. Cephalic at last u/s Length of Stay:  6  Days  Subjective: Pt comfortable, denies bleeding or contractions Patient reports the fetal movement as active. Patient reports uterine contraction  activity as none. Patient reports  vaginal bleeding as none. Patient describes fluid per vagina as None.  Vitals:  Blood pressure 89/52, pulse 90, temperature 98.5 F (36.9 C), temperature source Oral, resp. rate 16, height 5\' 1"  (1.549 m), weight 138 lb 8 oz (62.823 kg), last menstrual period 12/23/2014, SpO2 97 %. Physical Examination:  General appearance - alert, well appearing, and in no distress, oriented to person, place, and time and normal appearing weight Heart - normal rate and regular rhythm Abdomen - soft, nontender, nondistended Fundal Height:  size equals dates Cervical Exam: Not evaluated. and f and fetal presentation is cephalic. Extremities: extremities normal, atraumatic, no cyanosis or edema and Homans sign is negative, no sign of DVT with DTRs 2+ bilaterally Membranes:intact  Fetal Monitoring:  Daily monitoring   Labs:  No results found for this or any previous visit (from the past 24 hour(s)).  Imaging Studies:     Currently EPIC will not allow sonographic studies to automatically populate into notes.  In the meantime, copy and paste results into note or free text.  Medications:  Scheduled . acyclovir  400 mg Oral TID  . docusate sodium  100 mg Oral Daily  . prenatal multivitamin  1 tablet Oral Q1200  . sodium chloride flush  3 mL Intravenous Q12H   I have reviewed the patient's current medications.  ASSESSMENT: Patient Active  Problem List   Diagnosis Date Noted  . Third trimester bleeding, antepartum 07/27/2015  . H/O cesarean section 07/26/2015  . Hx of preeclampsia, prior pregnancy, currently pregnant 07/26/2015  . Vaginal bleeding during pregnancy, antepartum 07/26/2015  . High-risk pregnancy 05/27/2015  . UTI (urinary tract infection) during pregnancy 05/27/2015  . Irregular intermenstrual bleeding 09/17/2011  . Hypothyroid 09/16/2010  . History of herpes genitalis 11/26/2009    PLAN: Continue bedrest as inpatient til d/c Saturday, day 7  Kezia Benevides A, MD 08/01/2015,7:35 AM

## 2015-08-02 MED ORDER — ACYCLOVIR 400 MG PO TABS: 400.0000 mg | ORAL_TABLET | Freq: Two times a day (BID) | ORAL | Status: DC

## 2015-08-02 NOTE — Discharge Summary (Signed)
Antenatal Physician Discharge Summary  Patient ID: [name] MRN: [id number] DOB/AGE: [birth year] 35 y.o.  Admit date: 07/26/2015 Discharge date: 08/02/2015  Admission Diagnoses: third trimester bleeding; placental abruption  Discharge Diagnoses: same  Prenatal Procedures: NST and ultrasound  Intrapartum Procedures: Neonatology  Significant Diagnostic Studies:  Results for orders placed or performed during the hospital encounter of 07/26/15 (from the past 168 hour(s))  Urinalysis, Routine w reflex microscopic (not at [facility])   Collection Time: 07/26/15 11:33 AM  Result Value Ref Range   Color, Urine YELLOW YELLOW   APPearance CLEAR CLEAR   Specific Gravity, Urine 1.015 1.005 - 1.030   pH 6.0 5.0 - 8.0   Glucose, UA NEGATIVE NEGATIVE mg/dL   Hgb urine dipstick LARGE (A) NEGATIVE   Bilirubin Urine NEGATIVE NEGATIVE   Ketones, ur NEGATIVE NEGATIVE mg/dL   Protein, ur NEGATIVE NEGATIVE mg/dL   Nitrite NEGATIVE NEGATIVE   Leukocytes, UA NEGATIVE NEGATIVE  Urine microscopic-add on   Collection Time: 07/26/15 11:33 AM  Result Value Ref Range   Squamous Epithelial / LPF 0-5 (A) NONE SEEN   WBC, UA 0-5 0 - 5 WBC/hpf   RBC / HPF 0-5 0 - 5 RBC/hpf   Bacteria, UA FEW (A) NONE SEEN  Wet prep, genital   Collection Time: 07/26/15 12:23 PM  Result Value Ref Range   Yeast Wet Prep HPF POC NONE SEEN NONE SEEN   Trich, Wet Prep NONE SEEN NONE SEEN   Clue Cells Wet Prep HPF POC NONE SEEN NONE SEEN   WBC, Wet Prep HPF POC FEW (A) NONE SEEN   Sperm NONE SEEN   CBC on admission   Collection Time: 07/26/15  1:53 PM  Result Value Ref Range   WBC 10.2 4.0 - 10.5 K/uL   RBC 3.99 3.87 - 5.11 MIL/uL   Hemoglobin 12.0 12.0 - 15.0 g/dL   HCT 62.9 (L) 52.8 - 41.3 %   MCV 85.5 78.0 - 100.0 fL   MCH 30.1 26.0 - 34.0 pg   MCHC 35.2 30.0 - 36.0 g/dL   RDW 24.4 01.0 - 27.2 %   Platelets 204 150 - 400 K/uL  RPR   Collection Time: 07/26/15  1:53 PM  Result Value Ref Range   RPR Ser Ql TEST  REQUEST RECEIVED WITHOUT APPROPRIATE SPECIMEN   HIV antibody   Collection Time: 07/26/15  1:53 PM  Result Value Ref Range   HIV Screen 4th Generation wRfx Non Reactive Non Reactive  Type and screen [facility]   Collection Time: 07/26/15  1:53 PM  Result Value Ref Range   ABO/RH(D) O POS    Antibody Screen NEG    Sample Expiration 07/29/2015   Culture, beta strep (group b only)   Collection Time: 07/26/15  3:50 PM  Result Value Ref Range   Specimen Description VAGINAL/RECTAL    Special Requests NONE    Culture (A)     GROUP B STREP(S.AGALACTIAE)ISOLATED Note: TESTING AGAINST S. AGALACTIAE NOT ROUTINELY PERFORMED DUE TO PREDICTABILITY OF AMP/PEN/VAN SUSCEPTIBILITY. Performed at [facility]    Report Status 07/27/2015 FINAL   RPR   Collection Time: 07/26/15  5:59 PM  Result Value Ref Range   RPR Ser Ql Non Reactive Non Reactive    Treatments: IV hydration and steroids: betamethasone  Hospital Course:  This is a 35 y.o. [id number] with IUP at [redacted]w[redacted]d admitted for vagina; bleeding. She was admitted and found to be hemodynamically stable.  No leaking of fluid or contractions noted.  She received betamethasone x 2 doses. She was observed for 7 days, fetal heart rate monitoring remained reassuring, and she had no signs/symptoms of further bleeding or other maternal-fetal concerns.  She was deemed stable for discharge to home with outpatient follow up.  Discharge Exam: BP 104/62 mmHg  Pulse 90  Temp(Src) 98.3 F (36.8 C) (Oral)  Resp 16  Ht  (1.549 m)  Wt 138 lb 8 oz (62.823 kg)  BMI 26.18 kg/m2  SpO2 97%  LMP 12/23/2014 General appearance: alert and no distress GI: soft, non-tender; bowel sounds normal; no masses,  no organomegaly and gravid Extremities: extremities normal, atraumatic, no cyanosis or edema  Discharge Condition: good  Disposition: to home  Discharge Instructions    Discharge activity:    Complete by:  As directed   No heavy  lifting or prolonged standing     Discharge diet:  No restrictions    Complete by:  As directed      Do not have sex or do anything that might make you have an orgasm    Complete by:  As directed      Notify physician for a general feeling that "something is not right"    Complete by:  As directed      Notify physician for increase or change in vaginal discharge    Complete by:  As directed      Notify physician for intestinal cramps, with or without diarrhea, sometimes described as "gas pain"    Complete by:  As directed      Notify physician for leaking of fluid    Complete by:  As directed      Notify physician for low, dull backache, unrelieved by heat or Tylenol    Complete by:  As directed      Notify physician for menstrual like cramps    Complete by:  As directed      Notify physician for pelvic pressure    Complete by:  As directed      Notify physician for uterine contractions.  These may be painless and feel like the uterus is tightening or the baby is  "balling up"    Complete by:  As directed      Notify physician for vaginal bleeding    Complete by:  As directed      PRETERM LABOR:  Includes any of the follwing symptoms that occur between 20 - [redacted] weeks gestation.  If these symptoms are not stopped, preterm labor can result in preterm delivery, placing your baby at risk    Complete by:  As directed             Medication List    STOP taking these medications        levothyroxine 100 MCG tablet  Commonly known as:  SYNTHROID, LEVOTHROID      TAKE these medications        acyclovir 400 MG tablet  Commonly known as:  ZOVIRAX  Take 1 tablet (400 mg total) by mouth 2 (two) times daily.     folic acid 1 MG tablet  Commonly known as:  FOLVITE  Take 1 mg by mouth daily.     prenatal multivitamin Tabs tablet  Take 1 tablet by mouth daily at 12 noon.           Follow-up Information    Follow up with [facility] In 5 days.   Why:  Pt has  appointment   Contact information:  [address] [place] [id number] [phone]      Signed: Willodean RosenthalHARRAWAY-SMITH, Nickson Middlesworth M.D. 08/02/2015, 8:48 AM

## 2015-08-02 NOTE — Discharge Instructions (Signed)
Hemorragia vaginal durante el embarazo (tercer trimestre) (Vaginal Bleeding During Pregnancy, Third Trimester)  Durante el embarazo, es comn tener una pequea hemorragia vaginal (manchas). A veces, la hemorragia es normal y no representa un problema, pero en algunas ocasiones es un sntoma de algo grave. Asegrese de decirle a su mdico de inmediato si tiene algn tipo de hemorragia vaginal. CUIDADOS EN EL HOGAR  Controle su afeccin para ver si hay cambios.  Siga las indicaciones de su mdico con respecto al Thorntongrado de actividad que Shafterpuede tener.  Si debe hacer reposo en cama:  Es posible que deba quedarse en cama y levantarse nicamente para ir al bao.  Quizs le permitan hacer The PNC Financialalgunas actividades.  Si es necesario, planifique que alguien la ayude.  Marcelino FreestoneEscriba:  La cantidad de toallas higinicas que [place]a cada da.  La frecuencia con la que se cambia las toallas higinicas.  Indique que tan empapados (saturados) estn.  No use tampones.  No se haga duchas vaginales.  No tenga relaciones sexuales ni orgasmos hasta que el mdico la autorice.  Siga los consejos de su mdico acerca de levantar objetos pesados, conducir automviles y Education officer, environmentalrealizar actividad fsica.  Si elimina tejido por la vagina, gurdelo para mostrrselo al American Expressmdico.  Tome los medicamentos solamente como se lo haya indicado el mdico.  No tome aspirina, ya que puede causar hemorragias.  Concurra a todas las visitas de control como se lo haya indicado el mdico. SOLICITE AYUDA SI:   Tiene una hemorragia vaginal.  Tiene clicos.  Tiene dolores de [place].  Tiene fiebre que no desaparece despus de Teacher, adult educationtomar medicamentos. SOLICITE AYUDA DE INMEDIATO SI:  Siente clicos muy intensos en la espalda o en el vientre (abdomen).  Tiene escalofros.  Tiene una prdida de lquido por la vagina.  Elimina cogulos grandes o tejido por la vagina.  Tiene ms hemorragia.  Se siente dbil o que va a desvanecerse.  Pierde el  conocimiento (se desmaya).  No siente que el beb se mueva tanto como antes. ASEGRESE DE QUE:  Comprende estas instrucciones.  Controlar su afeccin.  Recibir ayuda de inmediato si no mejora o si empeora.   Esta informacin no tiene Theme park managercomo fin reemplazar el consejo del mdico. Asegrese de hacerle al mdico cualquier pregunta que tenga.   Document Released: 08/20/2013 Elsevier Interactive Patient Education [employer]2016 [employer]vier Inc.

## 2015-08-07 ENCOUNTER — Encounter: Payer: Self-pay | Admitting: Family Medicine

## 2015-08-07 ENCOUNTER — Ambulatory Visit (INDEPENDENT_AMBULATORY_CARE_PROVIDER_SITE_OTHER): Payer: Self-pay | Admitting: Family Medicine

## 2015-08-07 VITALS — BP 92/58 | HR 77 | Temp 98.4°F | Wt 138.0 lb

## 2015-08-07 DIAGNOSIS — R8271 Bacteriuria: Secondary | ICD-10-CM | POA: Insufficient documentation

## 2015-08-07 DIAGNOSIS — O34219 Maternal care for unspecified type scar from previous cesarean delivery: Secondary | ICD-10-CM

## 2015-08-07 DIAGNOSIS — O09523 Supervision of elderly multigravida, third trimester: Secondary | ICD-10-CM

## 2015-08-07 DIAGNOSIS — O09529 Supervision of elderly multigravida, unspecified trimester: Secondary | ICD-10-CM | POA: Insufficient documentation

## 2015-08-07 DIAGNOSIS — O4693 Antepartum hemorrhage, unspecified, third trimester: Secondary | ICD-10-CM

## 2015-08-07 DIAGNOSIS — Z3493 Encounter for supervision of normal pregnancy, unspecified, third trimester: Secondary | ICD-10-CM

## 2015-08-07 NOTE — Progress Notes (Signed)
C/o right lower quadrant pain x 1 day, no spotting or urinary concerns.

## 2015-08-07 NOTE — Patient Instructions (Addendum)
Lactancia materna (Breastfeeding) Decidir amamantar es una de las mejores elecciones que puede hacer por usted y su beb. El cambio hormonal durante el embarazo produce el desarrollo del tejido mamario y aumenta la cantidad y el tamao de los conductos galactforos. Estas hormonas tambin permiten que las protenas, los azcares y las grasas de la sangre produzcan la leche materna en las glndulas productoras de leche. Las hormonas impiden que la leche materna sea liberada antes del nacimiento del beb, adems de impulsar el flujo de leche luego del nacimiento. Una vez que ha comenzado a amamantar, pensar en el beb, as como la succin o el llanto, pueden estimular la liberacin de leche de las glndulas productoras de leche.  LOS BENEFICIOS DE AMAMANTAR Para el beb  La primera leche (calostro) ayuda a mejorar el funcionamiento del sistema digestivo del beb.  La leche tiene anticuerpos que ayudan a prevenir las infecciones en el beb.  El beb tiene una menor incidencia de asma, alergias y del sndrome de muerte sbita del lactante.  Los nutrientes en la leche materna son mejores para el beb que la leche maternizada y estn preparados exclusivamente para cubrir las necesidades del beb.  La leche materna mejora el desarrollo cerebral del beb.  Es menos probable que el beb desarrolle otras enfermedades, como obesidad infantil, asma o diabetes mellitus de tipo 2. Para usted   La lactancia materna favorece el desarrollo de un vnculo muy especial entre la madre y el beb.  Es conveniente. La leche materna siempre est disponible a la temperatura correcta y es econmica.  La lactancia materna ayuda a quemar caloras y a perder el peso ganado durante el embarazo.  Favorece la contraccin del tero al tamao que tena antes del embarazo de manera ms rpida y disminuye el sangrado (loquios) despus del parto.  La lactancia materna contribuye a reducir el riesgo de desarrollar diabetes  mellitus de tipo 2, osteoporosis o cncer de mama o de ovario en el futuro. SIGNOS DE QUE EL BEB EST HAMBRIENTO Primeros signos de hambre  Aumenta su estado de alerta o actividad.  Se estira.  Mueve la cabeza de un lado a otro.  Mueve la cabeza y abre la boca cuando se le toca la mejilla o la comisura de la boca (reflejo de bsqueda).  Aumenta las vocalizaciones, tales como sonidos de succin, se relame los labios, emite arrullos, suspiros, o chirridos.  Mueve la mano hacia la boca.  Se chupa con ganas los dedos o las manos. Signos tardos de hambre  Est agitado.  Llora de manera intermitente. Signos de hambre extrema Los signos de hambre extrema requerirn que lo calme y lo consuele antes de que el beb pueda alimentarse adecuadamente. No espere a que se manifiesten los siguientes signos de hambre extrema para comenzar a amamantar:   Agitacin.  Llanto intenso y fuerte.  Gritos. INFORMACIN BSICA SOBRE LA LACTANCIA MATERNA Iniciacin de la lactancia materna  Encuentre un lugar cmodo para sentarse o acostarse, con un buen respaldo para el cuello y la espalda.  Coloque una almohada o una manta enrollada debajo del beb para acomodarlo a la altura de la mama (si est sentada). Las almohadas para amamantar se han diseado especialmente a fin de servir de apoyo para los brazos y el beb mientras amamanta.  Asegrese de que el abdomen del beb est frente al suyo.   Masajee suavemente la mama. Con las yemas de los dedos, masajee la pared del pecho hacia el pezn en un movimiento circular.   Esto estimula el flujo de [place] Se[place]kleche. Es posible que Engineer, manufacturing systemsdeba continuar este movimiento mientras amamanta si la leche fluye lentamente.  Sostenga la mama con el pulgar por arriba del pezn y los otros 4 dedos por debajo de la mama. Asegrese de que los dedos se encuentren lejos del pezn y de la boca del beb.  Empuje suavemente los labios del beb con el pezn o con el dedo.  Cuando la boca del  beb se abra lo suficiente, acrquelo rpidamente a la mama e introduzca todo el pezn y la zona oscura que lo rodea (areola), tanto como sea posible, dentro de la boca del beb.  Debe haber ms areola visible por arriba del labio superior del beb que por debajo del labio inferior.  La lengua del beb debe estar entre la enca inferior y la Bardwellmama.  Asegrese de que la boca del beb est en la posicin correcta alrededor del pezn (prendida). Los labios del beb deben crear un sello sobre la mama y estar doblados hacia afuera (invertidos).  Es comn que el beb succione durante 2 a 3 minutos para que comience el flujo de [place]leche materna. Cmo debe prenderse Es muy importante que le ensee al beb cmo prenderse adecuadamente a la mama. Si el beb no se prende adecuadamente, puede causarle dolor en el pezn y reducir la produccin de Paradise Valleyleche materna, y hacer que el beb tenga un escaso aumento de North Tunicapeso. Adems, si el beb no se prende adecuadamente al pezn, puede tragar aire durante la alimentacin. Esto puede causarle molestias al beb. Hacer eructar al beb al Pilar Platecambiar de mama puede ayudarlo a liberar el aire. Sin embargo, ensearle al beb cmo prenderse a la mama adecuadamente es la mejor manera de evitar que se sienta molesto por tragar Oceanographeraire mientras se alimenta. Signos de que el beb se ha prendido adecuadamente al pezn:   Payton Doughtyironea o succiona de modo silencioso, sin causarle dolor.  Se escucha que traga cada 3 o 4 succiones.  Hay movimientos musculares por arriba y por delante de sus odos al Printmakersuccionar. Signos de que el beb no se ha prendido Audiological scientistadecuadamente al pezn:   Hace ruidos de succin o de chasquido mientras se alimenta.  Siente dolor en el pezn. Si cree que el beb no se prendi correctamente, deslice el dedo en la comisura de la boca y Ameren Corporationcolquelo entre las encas del beb para interrumpir la succin. Intente comenzar a amamantar nuevamente. Signos de Fish farm managerlactancia materna exitosa Signos  del beb:   Disminuye gradualmente el nmero de succiones o cesa la succin por completo.  Se duerme.  Relaja el cuerpo.  Retiene una pequea cantidad de Kindred Healthcareleche en la boca.  Se desprende solo del pecho. Signos que presenta usted:  Las mamas han aumentado la firmeza, el peso y el tamao 1 a 3 horas despus de Museum/gallery exhibitions officeramamantar.  Estn ms blandas inmediatamente despus de amamantar.  Un aumento del volumen de [place]che, y tambin un cambio en su consistencia y color se producen hacia el quinto da de Tour managerlactancia materna.  Los pezones no duelen, ni estn agrietados ni sangran. Signos de que su beb recibe la cantidad de leche suficiente  Moja al menos 3 paales en 24 horas. La orina debe ser clara y de color amarillo plido a los [address]  [address] de [place]ida.  Defeca al menos 3 veces en 24 horas a los [address]  [address] de [address]. La materia fecal debe ser blanda y Grand Detouramarillenta.  Defeca al menos 3 veces en 24 horas a los  7 das de vida. La materia fecal debe ser grumosa y Schram Cityamarillenta.  No registra una prdida de peso mayor del 10% del peso al nacer durante los primeros 3 [address]  [address] de [place]ida.  Aumenta de peso un promedio de 4 a 7onzas (113 a 198g) por semana despus de los [address]  [address] de vida.  Aumenta de Hannasvillepeso, Waterbury Centerdiariamente, de Verdunvillemanera uniforme a Glass blower/designerpartir de los [address]  [address] de vida, sin Passenger transport managerregistrar prdida de peso despus de las 2semanas de vida. Despus de alimentarse, es posible que el beb regurgite una pequea cantidad. Esto es frecuente. FRECUENCIA Y DURACIN DE LA LACTANCIA MATERNA El amamantamiento frecuente la ayudar a producir ms Azerbaijanleche y a Education officer, communityprevenir problemas de Engineer, miningdolor en los pezones e hinchazn en las [place]amamas. Alimente al beb cuando muestre signos de hambre o si siente la necesidad de reducir la congestin de las Cabanmamas. Esto se denomina "lactancia a demanda". Evite el uso del chupete mientras trabaja para establecer la lactancia (las primeras 4 a 6 semanas despus del nacimiento del beb). Despus de este perodo, podr ofrecerle un  chupete. Las investigaciones demostraron que el uso del chupete durante el primer ao de vida del beb disminuye el riesgo de desarrollar el sndrome de muerte sbita del lactante (SMSL). Permita que el nio se alimente en cada mama todo lo que desee. Contine amamantando al beb hasta que haya terminado de alimentarse. Cuando el beb se desprende o se queda dormido mientras se est alimentando de la primera mama, ofrzcale la segunda. Debido a que, con frecuencia, los recin Sunoconacidos permanecen somnolientos las primeras semanas de vida, es posible que deba despertar al beb para alimentarlo. Los horarios de Acupuncturistlactancia varan de un beb a otro. Sin embargo, las siguientes reglas pueden servir como gua para ayudarla a Lawyergarantizar que el beb se alimenta adecuadamente:  Se puede amamantar a los recin nacidos (bebs de 4 semanas o menos de vida) cada 1 a 3 horas.  No deben transcurrir ms de 3 horas durante el da o 5 horas durante la noche sin que se amamante a los recin nacidos.  Debe amamantar al beb 8 veces como mnimo en un perodo de 24 horas, hasta que comience a introducir slidos en su dieta, a los 6 meses de vida aproximadamente. EXTRACCIN DE Dean Foods CompanyLECHE MATERNA La extraccin y Contractorel almacenamiento de la leche materna le permiten asegurarse de que el beb se alimente exclusivamente de Glencoeleche materna, aun en momentos en los que no puede amamantar. Esto tiene especial importancia si debe regresar al Aleen Campitrabajo en el perodo en que an est amamantando o si no puede estar presente en los momentos en que el beb debe alimentarse. Su asesor en lactancia puede orientarla sobre cunto tiempo es seguro almacenar Ste[place]oatleche materna.  El sacaleche es un aparato que le permite extraer leche de la mama a un recipiente estril. Luego, la leche materna extrada puede almacenarse en un refrigerador o Electrical engineercongelador. Algunos sacaleches son Birdie Riddlemanuales, Delaney Meigsmientras que otros son elctricos. Consulte a su asesor en lactancia qu tipo ser  ms conveniente para usted. Los sacaleches se pueden comprar; sin embargo, algunos hospitales y grupos de apoyo a la lactancia materna alquilan Sports coachsacaleches mensualmente. Un asesor en lactancia puede ensearle cmo extraer W. R. Berkleyleche materna manualmente, en caso de que prefiera no usar un sacaleche.  CMO CUIDAR LAS MAMAS DURANTE LA LACTANCIA MATERNA Los pezones se secan, agrietan y duelen durante la Tour managerlactancia materna. Las siguientes recomendaciones pueden ayudarla a Pharmacologistmantener las TEPPCO Partnersmamas humectadas y sanas:  Careers information officervite usar jabn en los pezones.  Use un sostn de soporte. Aunque no son esenciales, las camisetas sin mangas o los sostenes especiales para Museum/gallery exhibitions officeramamantar estn diseados para acceder fcilmente a las mamas, para Museum/gallery exhibitions officeramamantar sin tener que quitarse todo el sostn o la camiseta. Evite usar sostenes con aro o sostenes muy ajustados.  Seque al aire sus pezones durante 3 a 4minutos despus de amamantar al beb.  Utilice solo apsitos de Haematologistalgodn en el sostn para Environmental health practitionerabsorber las prdidas de Abseconleche. La prdida de un poco de Public Service Enterprise Groupleche materna entre las tomas es normal.  Utilice lanolina sobre los pezones luego de Museum/gallery exhibitions officeramamantar. La lanolina ayuda a mantener la humedad normal de la piel. Si Botswanausa lanolina pura, no tiene que lavarse los pezones antes de volver a Corporate treasureralimentar al beb. La lanolina pura no es txica para el beb. Adems, puede extraer Beazer Homesmanualmente algunas gotas de Foremanleche materna y Engineer, maintenance (IT)masajear suavemente esa Winn-Dixieleche sobre los pezones, para que la Lake Henryleche se seque al aire. Durante las primeras semanas despus de dar a luz, algunas mujeres pueden experimentar hinchazn en las mamas (congestin Lakes of the Northmamaria). La congestin puede hacer que sienta las mamas pesadas, calientes y sensibles al tacto. El pico de la congestin ocurre dentro de los 3 a 5 das despus del Westonparto. Las siguientes recomendaciones pueden ayudarla a Paramedicaliviar la congestin:  Vace por completo las mamas al QUALCOMMamamantar o Environmental health practitionerextraer leche. Puede aplicar calor hmedo en las mamas  (en la ducha o con toallas hmedas para manos) antes de Museum/gallery exhibitions officeramamantar o extraer WPS Resourcesleche. Esto aumenta la circulacin y Saint Vincent and the Grenadinesayuda a que la Harrisleche fluya. Si el beb no vaca por completo las [address]amas cuando lo [address] Aveamamanta, extraiga la Claremoreleche restante despus de que haya finalizado.  Use un sostn ajustado (para amamantar o comn) o una camiseta sin mangas durante 1 o 2 das para indicar al cuerpo que disminuya ligeramente la produccin de Brushleche.  Aplique compresas de hielo Yahoo! Incsobre las mamas, a menos que le resulte demasiado incmodo.  Asegrese de que el beb est prendido y se encuentre en la posicin correcta mientras lo alimenta. Si la congestin persiste luego de 48 horas o despus de seguir estas recomendaciones, comunquese con su mdico o un Holiday representativeasesor en lactancia. RECOMENDACIONES GENERALES PARA EL CUIDADO DE LA SALUD DURANTE LA LACTANCIA MATERNA  Consuma alimentos saludables. Alterne comidas y colaciones, y coma 3 de cada una por da. Dado que lo que come Danaher Corporationafecta la leche materna, es posible que algunas comidas hagan que su beb se vuelva ms irritable de lo habitual. Evite comer este tipo de alimentos si percibe que afectan de manera negativa al beb.  Beba leche, jugos de fruta y agua para Patent examinersatisfacer su sed (aproximadamente 10 vasos al Futures traderda).  Descanse con frecuencia, reljese y tome sus vitaminas prenatales para evitar la fatiga, el estrs y la anemia.  Contine con los autocontroles de la mama.  Evite Product managermasticar y fumar tabaco. Las sustancias qumicas de los cigarrillos que pasan a la leche materna y la exposicin al humo ambiental del tabaco pueden daar al beb.  No consuma alcohol ni drogas, incluida la marihuana. Algunos medicamentos, que pueden ser perjudiciales para el beb, pueden pasar a travs de la Colgate Palmoliveleche materna. Es importante que consulte a su mdico antes de Medical sales representativetomar cualquier medicamento, incluidos todos los medicamentos recetados y de Mosqueroventa libre, as como los suplementos vitamnicos y  herbales. Puede quedar embarazada durante la lactancia. Si desea controlar la natalidad, consulte a su mdico cules son las opciones ms seguras para el beb. SOLICITE ATENCIN MDICA SI:   Daphane ShepherdUsted  siente que quiere dejar de Museum/gallery exhibitions officeramamantar o se siente frustrada con la lactancia.  Siente dolor en las mamas o en los pezones.  Sus pezones estn agrietados o Water quality scientistsangran.  Sus pechos estn irritados, sensibles o calientes.  Tiene un rea hinchada en cualquiera de las mamas.  Siente escalofros o fiebre.  Tiene nuseas o vmitos.  Presenta una secrecin de otro lquido distinto de la leche materna de los pezones.  Sus mamas no se llenan antes de Museum/gallery exhibitions officeramamantar al beb para el quinto da despus del Enoreeparto.  Se siente triste y deprimida.  El beb est demasiado somnoliento como para comer bien.  El beb tiene problemas para dormir.  Moja menos de 3 paales en 24 horas.  Defeca menos de 3 veces en 24 horas.  La piel del beb o la parte blanca de los ojos se vuelven amarillentas.  El beb no ha aumentado de West Endpeso a los [address] das de [place]vida. SOLICITE ATENCIN MDICA DE INMEDIATO SI:   El beb est muy cansado Retail buyer(letargo) y no se quiere despertar para comer.  Le sube la fiebre sin causa.   Esta informacin no tiene Theme park managercomo fin reemplazar el consejo del mdico. Asegrese de hacerle al mdico cualquier pregunta que tenga.   Document Released: 04/05/2005 Document Revised: 12/25/2014 Elsevier Interactive Patient Education Yahoo! Inc2016 Elsevier Inc.

## 2015-08-07 NOTE — Progress Notes (Signed)
Subjective:  [name] is a 35 y.o. [phone] at 5192w3d being seen today for ongoing prenatal care.  She is currently monitored for the following issues for this high-risk pregnancy and has History of herpes genitalis; Hypothyroid; Irregular intermenstrual bleeding; Supervision of normal pregnancy; UTI (urinary tract infection) during pregnancy; Previous cesarean delivery affecting pregnancy, antepartum; Hx of preeclampsia, prior pregnancy, currently pregnant; Third trimester bleeding, antepartum; Group B streptococcal bacteriuria; and AMA (advanced maternal age) multigravida 35+ on her problem list.  Patient reports no complaints.  Contractions: Not present.  .  Movement: Present. Denies leaking of fluid.   The following portions of the patient's history were reviewed and updated as appropriate: allergies, current medications, past family history, past medical history, past social history, past surgical history and problem list. Problem list updated.  Objective:   Filed Vitals:   08/07/15 1355  BP: 92/58  Pulse: 77  Temp: 98.4 F (36.9 C)  Weight: 138 lb (62.596 kg)    Fetal Status: Fetal Heart Rate (bpm): 143 Fundal Height: 34 cm Movement: Present     General:  Alert, oriented and cooperative. Patient is in no acute distress.  Skin: Skin is warm and dry. No rash noted.   Cardiovascular: Normal heart rate noted  Respiratory: Normal respiratory effort, no problems with respiration noted  Abdomen: Soft, gravid, appropriate for gestational age. Pain/Pressure: Present     Pelvic:       Cervical exam deferred        Extremities: Normal range of motion.     Mental Status: Normal mood and affect. Normal behavior. Normal judgment and thought content.     Assessment and Plan:  Pregnancy: [id number] at 4092w3d  1. Group B streptococcal bacteriuria Will need treatment in labor  2. Supervision of normal pregnancy, third trimester [facility]  3. AMA (advanced maternal age)  multigravida 35+, third trimester Declined genetic testing  4. Previous cesarean delivery affecting pregnancy, antepartum Desires elective repeat  5. Third trimester bleeding, antepartum Probable chronic abruption--though no further bleeding-->[facility]  Preterm labor symptoms and general obstetric precautions including but not limited to vaginal bleeding, contractions, leaking of fluid and fetal movement were reviewed in detail with the patient. Please refer to After Visit Summary for other counseling recommendations.  Return in 2 weeks (on 08/21/2015).   Reva Boresanya S Katriana Dortch, MD

## 2015-08-08 ENCOUNTER — Telehealth: Payer: Self-pay | Admitting: *Deleted

## 2015-08-08 ENCOUNTER — Encounter (HOSPITAL_COMMUNITY): Payer: Self-pay | Admitting: *Deleted

## 2015-08-08 NOTE — Telephone Encounter (Signed)
Called pt w/[employer] interpreter Charlie # [phone]. Pt was advised that all future appts will take place @ our office and that she will be seen twice weekly for NST's beginning on 4/27. This plan of care is due to the bleeding she has been having. Pt voiced understanding agreed to recommendations and stated that she is aware of the clinic location.

## 2015-08-09 NOTE — H&P (Signed)
Author: Kathrynn RunningNoah Bedford Wouk, MD Service: Obstetrics/Gynecology Author Type: Physician   Filed: 07/26/2015 1:37 PM Note Time: 07/26/2015 1:31 PM Status: Attested   Editor: Kathrynn RunningNoah Bedford Wouk, MD (Physician)     Related Notes: Original Note by Kathrynn RunningNoah Bedford Wouk, MD (Physician) filed at 07/26/2015 1:36 PM   Cosigner: Adam PhenixJames G Arnold, MD at 08/09/2015 3:41 PM       Attestation signed by Adam PhenixJames G Arnold, MD at 08/09/2015 3:41 PM  Attestation of Attending Supervision of Obstetric Fellow: Evaluation and management procedures were performed by the Obstetric Fellow under my supervision and collaboration. I have reviewed the Obstetric Fellow's note and chart, and I agree with the management and plan.  Scheryl DarterJAMES ARNOLD, MD, FACOG Attending Obstetrician & Gynecologist Faculty Practice, [facility]      Expand All Collapse All   ANTEPARTUM ADMISSION HISTORY AND PHYSICAL NOTE   History of Present Illness: Janeece [name] is a 35 y.o. [id number] at 8232w5d presenting w/ vaginal bleeding.  Began this morning. First time this pregnancy. Hx previa resolved on u/s 2 days ago. Nothing in vagina last 72 hours. No abdominal or vaginal pain. No dysuria or discharge. No fever, no n/v/d. Otherwise feeling well. Bleeding is less than a regular period bleeding.  Patient Active Problem List   Diagnosis Date Noted  . H/O cesarean section 07/26/2015  . Hx of preeclampsia, prior pregnancy, currently pregnant 07/26/2015  . Vaginal bleeding during pregnancy, antepartum 07/26/2015  . Placenta previa 07/14/2015  . High-risk pregnancy 05/27/2015  . UTI (urinary tract infection) during pregnancy 05/27/2015  . Irregular intermenstrual bleeding 09/17/2011  . Contraception management 11/10/2010  . Hypothyroid 09/16/2010  . History of herpes genitalis 11/26/2009    Past Medical History  Diagnosis Date  . Thyroid disease     hypothyroid  . Hypothyroidism    . Mental disorder     Post partum depression/anxiety  . Herpes simplex     [place] in [place], no recent breakouts    Past Surgical History  Procedure Laterality Date  . Cholecystectomy    . Cesarean section      OB History  Gravida Para Term Preterm AB SAB TAB Ectopic Multiple Living  6 1 1  0 4 3 1  0 0 1    # Outcome Date GA Lbr Len/2nd Weight Sex Delivery Anes PTL Lv  6 Current           5 TAB           4 SAB           3 SAB           2 SAB           1 Term             Obstetric Comments  3 spontaneous abortions all at [redacted] weeks gestation  Last pregnancy 5 years ago (daughter born at 5437 weeks)    Social History   Social History  . Marital Status: Single    Spouse Name: N/A  . Number of Children: N/A  . Years of Education: N/A   Social History Main Topics  . Smoking status: Former Smoker -- 0.75 packs/day for .1 years    Types: Cigarettes  . Smokeless tobacco: Never Used  . Alcohol Use: No  . Drug Use: No  . Sexual Activity: No   Other Topics Concern  . None   Social History Narrative    History reviewed. No pertinent family history.  No Known  Allergies  Prescriptions prior to admission  Medication Sig Dispense Refill Last Dose  . folic acid (FOLVITE) 1 MG tablet Take 1 mg by mouth daily.   07/25/2015 at Unknown time  . Prenatal Vit-Fe Fumarate-FA (PRENATAL MULTIVITAMIN) TABS tablet Take 1 tablet by mouth daily at 12 noon.   07/25/2015 at Unknown time  . levothyroxine (SYNTHROID, LEVOTHROID) 100 MCG tablet Take 1 tablet (100 mcg total) by mouth daily. (Patient not taking: Reported on 07/26/2015) 90 tablet 2 Not Taking    Review of Systems - Negative except as per hpi  Vitals: BP 110/74 mmHg  Pulse 83  Temp(Src) 98.4 F (36.9 C) (Oral)  Resp 20   Ht  (1.549 m)  Wt 141 lb (63.957 kg)  BMI 26.66 kg/m2  LMP 12/23/2014 Physical Examination: CONSTITUTIONAL: Well-developed, well-nourished female in no acute distress.  HENT: Normocephalic, atraumatic, External right and left ear normal. Oropharynx is clear and moist EYES: Conjunctivae and EOM are normal. Pupils are equal, round, and reactive to light. No scleral icterus.  NECK: Normal range of motion, supple, no masses SKIN: Skin is warm and dry. No rash noted. Not diaphoretic. No erythema. No pallor. NEUROLGIC: Alert and oriented to person, place, and time. Normal reflexes, muscle tone coordination. No cranial nerve deficit noted. PSYCHIATRIC: Normal mood and affect. Normal behavior. Normal judgment and thought content. CARDIOVASCULAR: Normal heart rate noted, regular rhythm RESPIRATORY: Effort and breath sounds normal, no problems with respiration noted ABDOMEN: Soft, nontender, nondistended, gravid. MUSCULOSKELETAL: Normal range of motion. No edema and no tenderness. 2+ distal pulses.  Cervix: visually closed. Mild/moderate amount dark red blood posterior fornix and blood-tinged mucous extending from os. Normal appearing cervix, no lesions.  Labs:  Results for orders placed or performed during the hospital encounter of 07/26/15 (from the past 24 hour(s))  Urinalysis, Routine w reflex microscopic (not at [facility])   Collection Time: 07/26/15 11:33 AM  Result Value Ref Range   Color, Urine YELLOW YELLOW   APPearance CLEAR CLEAR   Specific Gravity, Urine 1.015 1.005 - 1.030   pH 6.0 5.0 - 8.0   Glucose, UA NEGATIVE NEGATIVE mg/dL   Hgb urine dipstick LARGE (A) NEGATIVE   Bilirubin Urine NEGATIVE NEGATIVE   Ketones, ur NEGATIVE NEGATIVE mg/dL   Protein, ur NEGATIVE NEGATIVE mg/dL   Nitrite NEGATIVE NEGATIVE   Leukocytes, UA NEGATIVE NEGATIVE  Urine microscopic-add on   Collection Time: 07/26/15 11:33 AM  Result Value  Ref Range   Squamous Epithelial / LPF 0-5 (A) NONE SEEN   WBC, UA 0-5 0 - 5 WBC/hpf   RBC / HPF 0-5 0 - 5 RBC/hpf   Bacteria, UA FEW (A) NONE SEEN  Wet prep, genital   Collection Time: 07/26/15 12:23 PM  Result Value Ref Range   Yeast Wet Prep HPF POC NONE SEEN NONE SEEN   Trich, Wet Prep NONE SEEN NONE SEEN   Clue Cells Wet Prep HPF POC NONE SEEN NONE SEEN   WBC, Wet Prep HPF POC FEW (A) NONE SEEN   Sperm NONE SEEN   Results for orders placed or performed in visit on 07/25/15 (from the past 24 hour(s))  HIV antibody   Collection Time: 07/25/15 2:48 PM  Result Value Ref Range   HIV 1&2 Ab, 4th Generation NONREACTIVE NONREACTIVE  RPR   Collection Time: 07/25/15 2:48 PM  Result Value Ref Range   RPR Ser Ql NON REAC NON REAC  CBC   Collection Time: 07/25/15 2:48 PM  Result Value Ref Range  WBC 9.4 3.8 - 10.8 K/uL   RBC 4.00 3.80 - 5.10 MIL/uL   Hemoglobin 11.9 11.7 - 15.5 g/dL   HCT 16.1 (L) 09.6 - 04.5 %   MCV 86.5 80.0 - 100.0 fL   MCH 29.8 27.0 - 33.0 pg   MCHC 34.4 32.0 - 36.0 g/dL   RDW 40.9 81.1 - 91.4 %   Platelets 213 140 - 400 K/uL   MPV 10.6 7.5 - 12.5 fL    Imaging Studies:  Imaging Results    [place] [facility] Ob Transvaginal  07/24/2015 OBSTETRICAL ULTRASOUND: This exam was performed within a [facility]. The OB US report was generated in the AS system, and faxed to the ordering physician. This report is available in the [employer]. See the AS Obstetric US report via the Image Link.  [place] Mfm Ob Follow Up  07/24/2015 OBSTETRICAL ULTRASOUND: This exam was performed within a [facility]. The OB US report was generated in the AS system, and faxed to the ordering physician. This report is available in the [employer]. See the AS Obstetric US report via the Image Link.     Assessment and Plan: Patient Active Problem  List   Diagnosis Date Noted  . H/O cesarean section 07/26/2015  . Hx of preeclampsia, prior pregnancy, currently pregnant 07/26/2015  . Vaginal bleeding during pregnancy, antepartum 07/26/2015  . Placenta previa 07/14/2015  . High-risk pregnancy 05/27/2015  . UTI (urinary tract infection) during pregnancy 05/27/2015  . Irregular intermenstrual bleeding 09/17/2011  . Contraception management 11/10/2010  . Hypothyroid 09/16/2010  . History of herpes genitalis 11/26/2009   34 yo [id number] @ 30+5 here with painless vaginal bleeding  #Vaginal bleeding - concern for mild abruption. Hx previa this pregnancy but resolved on u/s 2 days ago. NST reactive, abdomen non-tender. UA not suggestive of infection and does not have other symptoms uti. Wet prep negative. G/C is pending. - repeat u/s ordered - BMZ now and in 24 hours - admit for observation - f/u gbs culture  Admit to Antenatal Routine antenatal care  Silvano Bilis, MD Ob/gyn fellow

## 2015-08-14 ENCOUNTER — Ambulatory Visit (INDEPENDENT_AMBULATORY_CARE_PROVIDER_SITE_OTHER): Payer: Self-pay | Admitting: Obstetrics & Gynecology

## 2015-08-14 VITALS — BP 101/58 | HR 77 | Wt 141.3 lb

## 2015-08-14 DIAGNOSIS — O09523 Supervision of elderly multigravida, third trimester: Secondary | ICD-10-CM

## 2015-08-14 DIAGNOSIS — O4693 Antepartum hemorrhage, unspecified, third trimester: Secondary | ICD-10-CM

## 2015-08-14 DIAGNOSIS — E039 Hypothyroidism, unspecified: Secondary | ICD-10-CM

## 2015-08-14 DIAGNOSIS — O99283 Endocrine, nutritional and metabolic diseases complicating pregnancy, third trimester: Secondary | ICD-10-CM

## 2015-08-14 DIAGNOSIS — A6009 Herpesviral infection of other urogenital tract: Secondary | ICD-10-CM

## 2015-08-14 DIAGNOSIS — O98313 Other infections with a predominantly sexual mode of transmission complicating pregnancy, third trimester: Secondary | ICD-10-CM

## 2015-08-14 DIAGNOSIS — O0993 Supervision of high risk pregnancy, unspecified, third trimester: Secondary | ICD-10-CM

## 2015-08-14 LAB — POCT URINALYSIS DIP (DEVICE)
BILIRUBIN URINE: NEGATIVE
GLUCOSE, UA: NEGATIVE mg/dL
Hgb urine dipstick: NEGATIVE
KETONES UR: NEGATIVE mg/dL
Leukocytes, UA: NEGATIVE
NITRITE: NEGATIVE
PH: 6 (ref 5.0–8.0)
Protein, ur: 30 mg/dL — AB
Specific Gravity, Urine: 1.025 (ref 1.005–1.030)
Urobilinogen, UA: 0.2 mg/dL (ref 0.0–1.0)

## 2015-08-14 NOTE — Patient Instructions (Signed)
Return to clinic for any scheduled appointments or obstetric concerns, or go to MAU for evaluation  

## 2015-08-14 NOTE — Progress Notes (Signed)
[facility]

## 2015-08-14 NOTE — Progress Notes (Signed)
Subjective:  [name] is a 35 y.o. [phone] at 5282w3d being seen today for ongoing prenatal care.  She is currently monitored for the following issues for this high-risk pregnancy and has History of herpes genitalis; Hypothyroid; Irregular intermenstrual bleeding; Supervision of high-risk pregnancy; UTI (urinary tract infection) during pregnancy; Previous cesarean delivery affecting pregnancy, antepartum; Hx of preeclampsia, prior pregnancy, currently pregnant; Third trimester bleeding, antepartum; Group B streptococcal bacteriuria; and AMA (advanced maternal age) multigravida 35+ on her problem list.  Patient is Spanish-speaking only, [employer] video Spanish interpreter present for this encounter.  Patient reports no complaints.  Contractions: Not present. Vag. Bleeding: None.  Movement: Present. Denies leaking of fluid.   The following portions of the patient's history were reviewed and updated as appropriate: allergies, current medications, past family history, past medical history, past social history, past surgical history and problem list. Problem list updated.  Objective:   Filed Vitals:   08/14/15 1048  BP: 101/58  Pulse: 77  Weight: 141 lb 4.8 oz (64.093 kg)    Fetal Status: Fetal Heart Rate (bpm): NST Fundal Height: 34 cm Movement: Present     General:  Alert, oriented and cooperative. Patient is in no acute distress.  Skin: Skin is warm and dry. No rash noted.   Cardiovascular: Normal heart rate noted  Respiratory: Normal respiratory effort, no problems with respiration noted  Abdomen: Soft, gravid, appropriate for gestational age. Pain/Pressure: Absent     Pelvic: Vag. Bleeding: None    Cervical exam deferred        Extremities: Normal range of motion.     Mental Status: Normal mood and affect. Normal behavior. Normal judgment and thought content.   Urinalysis: Urine Protein: 1+ Urine Glucose: Negative NST performed today was reviewed and was found to be reactive.     Assessment and Plan:  Pregnancy: [id number] at 3282w3d  1. Third trimester bleeding, antepartum Rescheduled RCS for this patient to 09/08/15 for chronic abruption, delivery indicated at 37 weeks.  Continue recommended antenatal testing and prenatal care. Growth scan ordered    2. Supervision of high-risk pregnancy, third trimester Preterm labor symptoms and general obstetric precautions including but not limited to vaginal bleeding, contractions, leaking of fluid and fetal movement were reviewed in detail with the patient. Please refer to After Visit Summary for other counseling recommendations.  Return for 2x/week testing and OB visits as scheduled.   Tereso NewcomerUgonna A Anyanwu, MD

## 2015-08-18 ENCOUNTER — Ambulatory Visit (INDEPENDENT_AMBULATORY_CARE_PROVIDER_SITE_OTHER): Payer: Self-pay | Admitting: General Practice

## 2015-08-18 VITALS — BP 103/66 | HR 71

## 2015-08-18 DIAGNOSIS — O4693 Antepartum hemorrhage, unspecified, third trimester: Secondary | ICD-10-CM

## 2015-08-20 ENCOUNTER — Inpatient Hospital Stay (HOSPITAL_COMMUNITY)
Admission: AD | Admit: 2015-08-20 | Discharge: 2015-08-22 | DRG: 766 | Disposition: A | Discharge status: Home / Self Care | Payer: Medicaid Other | Source: Ambulatory Visit | Attending: Obstetrics & Gynecology | Admitting: Obstetrics & Gynecology

## 2015-08-20 ENCOUNTER — Inpatient Hospital Stay (HOSPITAL_COMMUNITY): Payer: Medicaid Other | Admitting: Anesthesiology

## 2015-08-20 ENCOUNTER — Encounter (HOSPITAL_COMMUNITY): Payer: Self-pay

## 2015-08-20 ENCOUNTER — Encounter (HOSPITAL_COMMUNITY)
Admission: AD | Disposition: A | Discharge status: Home / Self Care | Payer: Self-pay | Source: Ambulatory Visit | Attending: Obstetrics & Gynecology

## 2015-08-20 DIAGNOSIS — Z9049 Acquired absence of other specified parts of digestive tract: Secondary | ICD-10-CM | POA: Diagnosis not present

## 2015-08-20 DIAGNOSIS — O99284 Endocrine, nutritional and metabolic diseases complicating childbirth: Secondary | ICD-10-CM | POA: Diagnosis present

## 2015-08-20 DIAGNOSIS — O42913 Preterm premature rupture of membranes, unspecified as to length of time between rupture and onset of labor, third trimester: Secondary | ICD-10-CM | POA: Diagnosis present

## 2015-08-20 DIAGNOSIS — Z98891 History of uterine scar from previous surgery: Secondary | ICD-10-CM

## 2015-08-20 DIAGNOSIS — Z3A34 34 weeks gestation of pregnancy: Secondary | ICD-10-CM

## 2015-08-20 DIAGNOSIS — O42919 Preterm premature rupture of membranes, unspecified as to length of time between rupture and onset of labor, unspecified trimester: Secondary | ICD-10-CM | POA: Diagnosis present

## 2015-08-20 DIAGNOSIS — O34219 Maternal care for unspecified type scar from previous cesarean delivery: Secondary | ICD-10-CM | POA: Diagnosis present

## 2015-08-20 DIAGNOSIS — E039 Hypothyroidism, unspecified: Secondary | ICD-10-CM | POA: Diagnosis present

## 2015-08-20 DIAGNOSIS — Z87891 Personal history of nicotine dependence: Secondary | ICD-10-CM

## 2015-08-20 DIAGNOSIS — O4693 Antepartum hemorrhage, unspecified, third trimester: Secondary | ICD-10-CM

## 2015-08-20 LAB — URINE MICROSCOPIC-ADD ON

## 2015-08-20 LAB — URINALYSIS, ROUTINE W REFLEX MICROSCOPIC
BILIRUBIN URINE: NEGATIVE
Glucose, UA: NEGATIVE mg/dL
Ketones, ur: 15 mg/dL — AB
Leukocytes, UA: NEGATIVE
NITRITE: NEGATIVE
PROTEIN: 30 mg/dL — AB
Specific Gravity, Urine: 1.02 (ref 1.005–1.030)
pH: 6 (ref 5.0–8.0)

## 2015-08-20 LAB — CBC
HEMATOCRIT: 33.7 % — AB (ref 36.0–46.0)
Hemoglobin: 11.7 g/dL — ABNORMAL LOW (ref 12.0–15.0)
MCH: 29.8 pg (ref 26.0–34.0)
MCHC: 34.7 g/dL (ref 30.0–36.0)
MCV: 85.8 fL (ref 78.0–100.0)
PLATELETS: 173 10*3/uL (ref 150–400)
RBC: 3.93 MIL/uL (ref 3.87–5.11)
RDW: 14.1 % (ref 11.5–15.5)
WBC: 7.3 10*3/uL (ref 4.0–10.5)

## 2015-08-20 LAB — TYPE AND SCREEN
ABO/RH(D): O POS
ANTIBODY SCREEN: NEGATIVE

## 2015-08-20 SURGERY — Surgical Case
Anesthesia: Spinal | Site: Abdomen

## 2015-08-20 MED ORDER — PRENATAL MULTIVITAMIN CH: 1.0000 | ORAL_TABLET | Freq: Every day | ORAL | Status: DC

## 2015-08-20 MED ORDER — AZITHROMYCIN 500 MG PO TABS: 500.0000 mg | ORAL_TABLET | Freq: Every day | ORAL | Status: DC

## 2015-08-20 MED ORDER — LACTATED RINGERS IV SOLN
INTRAVENOUS | Status: DC | PRN
Start: 2015-08-20 — End: 2015-08-20
  Administered 2015-08-20 (×3): via INTRAVENOUS

## 2015-08-20 MED ORDER — FENTANYL CITRATE (PF) 100 MCG/2ML IJ SOLN: 25.0000 ug | INTRAMUSCULAR | Status: DC | PRN

## 2015-08-20 MED ORDER — METOCLOPRAMIDE HCL 5 MG/ML IJ SOLN: 10.0000 mg | Freq: Once | INTRAMUSCULAR | Status: DC | PRN

## 2015-08-20 MED ORDER — FAMOTIDINE IN NACL 20-0.9 MG/50ML-% IV SOLN
INTRAVENOUS | Status: AC
  Filled 2015-08-20: qty 50

## 2015-08-20 MED ORDER — BUPIVACAINE IN DEXTROSE 0.75-8.25 % IT SOLN
INTRATHECAL | Status: DC | PRN
  Administered 2015-08-20: 10.5 mg via INTRATHECAL

## 2015-08-20 MED ORDER — MORPHINE SULFATE (PF) 0.5 MG/ML IJ SOLN
INTRAMUSCULAR | Status: DC | PRN
  Administered 2015-08-20: .2 mg via INTRATHECAL

## 2015-08-20 MED ORDER — CITRIC ACID-SODIUM CITRATE 334-500 MG/5ML PO SOLN
ORAL | Status: AC
  Administered 2015-08-20: 30 mL via ORAL
  Filled 2015-08-20: qty 15

## 2015-08-20 MED ORDER — MEPERIDINE HCL 25 MG/ML IJ SOLN: 6.2500 mg | INTRAMUSCULAR | Status: DC | PRN

## 2015-08-20 MED ORDER — MORPHINE SULFATE (PF) 0.5 MG/ML IJ SOLN
INTRAMUSCULAR | Status: AC
  Filled 2015-08-20: qty 10

## 2015-08-20 MED ORDER — DEXTROSE 5 % IV SOLN
500.0000 mg | INTRAVENOUS | Status: DC
  Administered 2015-08-20: 500 mg via INTRAVENOUS
  Filled 2015-08-20: qty 500

## 2015-08-20 MED ORDER — FENTANYL CITRATE (PF) 100 MCG/2ML IJ SOLN
INTRAMUSCULAR | Status: DC | PRN
  Administered 2015-08-20: 20 ug via INTRATHECAL

## 2015-08-20 MED ORDER — ZOLPIDEM TARTRATE 5 MG PO TABS: 5.0000 mg | ORAL_TABLET | Freq: Every evening | ORAL | Status: DC | PRN

## 2015-08-20 MED ORDER — FAMOTIDINE IN NACL 20-0.9 MG/50ML-% IV SOLN: 20.0000 mg | Freq: Once | INTRAVENOUS | Status: DC

## 2015-08-20 MED ORDER — SODIUM CHLORIDE 0.9 % IR SOLN
Status: DC | PRN
  Administered 2015-08-20: 1000 mL

## 2015-08-20 MED ORDER — LACTATED RINGERS IV SOLN
INTRAVENOUS | Status: DC | PRN
  Administered 2015-08-20: 21:00:00 via INTRAVENOUS

## 2015-08-20 MED ORDER — IBUPROFEN 600 MG PO TABS
600.0000 mg | ORAL_TABLET | Freq: Four times a day (QID) | ORAL | Status: DC
  Administered 2015-08-21 – 2015-08-22 (×7): 600 mg via ORAL
  Filled 2015-08-20 (×7): qty 1

## 2015-08-20 MED ORDER — ONDANSETRON HCL 4 MG/2ML IJ SOLN
INTRAMUSCULAR | Status: AC
  Filled 2015-08-20: qty 2

## 2015-08-20 MED ORDER — OXYTOCIN 10 UNIT/ML IJ SOLN
INTRAMUSCULAR | Status: AC
  Filled 2015-08-20: qty 4

## 2015-08-20 MED ORDER — PHENYLEPHRINE 8 MG IN D5W 100 ML (0.08MG/ML) PREMIX OPTIME
INJECTION | INTRAVENOUS | Status: DC | PRN
  Administered 2015-08-20: 60 ug/min via INTRAVENOUS

## 2015-08-20 MED ORDER — SODIUM CHLORIDE 0.9 % IV SOLN
INTRAVENOUS | Status: DC
  Administered 2015-08-20: 18:00:00 via INTRAVENOUS

## 2015-08-20 MED ORDER — OXYTOCIN 10 UNIT/ML IJ SOLN
40.0000 [IU] | INTRAVENOUS | Status: DC | PRN
  Administered 2015-08-20: 40 [IU] via INTRAVENOUS

## 2015-08-20 MED ORDER — AMOXICILLIN 500 MG PO CAPS: 500.0000 mg | ORAL_CAPSULE | Freq: Three times a day (TID) | ORAL | Status: DC

## 2015-08-20 MED ORDER — PHENYLEPHRINE 8 MG IN D5W 100 ML (0.08MG/ML) PREMIX OPTIME
INJECTION | INTRAVENOUS | Status: AC
  Filled 2015-08-20: qty 100

## 2015-08-20 MED ORDER — CALCIUM CARBONATE ANTACID 500 MG PO CHEW: 2.0000 | CHEWABLE_TABLET | ORAL | Status: DC | PRN

## 2015-08-20 MED ORDER — FENTANYL CITRATE (PF) 100 MCG/2ML IJ SOLN
INTRAMUSCULAR | Status: AC
  Filled 2015-08-20: qty 2

## 2015-08-20 MED ORDER — ACETAMINOPHEN 325 MG PO TABS: 650.0000 mg | ORAL_TABLET | ORAL | Status: DC | PRN

## 2015-08-20 MED ORDER — SODIUM CHLORIDE 0.9 % IV SOLN
2.0000 g | Freq: Four times a day (QID) | INTRAVENOUS | Status: DC
  Administered 2015-08-20: 2 g via INTRAVENOUS
  Filled 2015-08-20 (×4): qty 2000

## 2015-08-20 MED ORDER — ONDANSETRON HCL 4 MG/2ML IJ SOLN
INTRAMUSCULAR | Status: DC | PRN
  Administered 2015-08-20: 4 mg via INTRAVENOUS

## 2015-08-20 MED ORDER — DOCUSATE SODIUM 100 MG PO CAPS
100.0000 mg | ORAL_CAPSULE | Freq: Every day | ORAL | Status: DC
Start: 2015-08-20 — End: 2015-08-20

## 2015-08-20 MED ORDER — BETAMETHASONE SOD PHOS & ACET 6 (3-3) MG/ML IJ SUSP
12.0000 mg | INTRAMUSCULAR | Status: DC
  Filled 2015-08-20: qty 2

## 2015-08-20 MED ORDER — CITRIC ACID-SODIUM CITRATE 334-500 MG/5ML PO SOLN
30.0000 mL | Freq: Once | ORAL | Status: AC
  Administered 2015-08-20: 30 mL via ORAL

## 2015-08-20 SURGICAL SUPPLY — 38 items
CLAMP CORD UMBIL (MISCELLANEOUS) IMPLANT
CLOTH BEACON ORANGE TIMEOUT ST (SAFETY) ×3 IMPLANT
DRSG OPSITE POSTOP 4X10 (GAUZE/BANDAGES/DRESSINGS) ×3 IMPLANT
DURAPREP 26ML APPLICATOR (WOUND CARE) ×6 IMPLANT
ELECT REM PT RETURN 9FT ADLT (ELECTROSURGICAL) ×3
ELECTRODE REM PT RTRN 9FT ADLT (ELECTROSURGICAL) ×1 IMPLANT
EXTRACTOR VACUUM BELL STYLE (SUCTIONS) IMPLANT
GLOVE BIOGEL PI IND STRL 6.5 (GLOVE) ×1 IMPLANT
GLOVE BIOGEL PI IND STRL 7.0 (GLOVE) ×2 IMPLANT
GLOVE BIOGEL PI IND STRL 8 (GLOVE) ×1 IMPLANT
GLOVE BIOGEL PI INDICATOR 6.5 (GLOVE) ×2
GLOVE BIOGEL PI INDICATOR 7.0 (GLOVE) ×4
GLOVE BIOGEL PI INDICATOR 8 (GLOVE) ×2
GLOVE ECLIPSE 6.5 STRL STRAW (GLOVE) ×3 IMPLANT
GLOVE ECLIPSE 8.0 STRL XLNG CF (GLOVE) ×3 IMPLANT
GOWN STRL REUS W/TWL LRG LVL3 (GOWN DISPOSABLE) ×6 IMPLANT
KIT ABG SYR 3ML LUER SLIP (SYRINGE) ×3 IMPLANT
LIQUID BAND (GAUZE/BANDAGES/DRESSINGS) ×9 IMPLANT
NEEDLE HYPO 18GX1.5 BLUNT FILL (NEEDLE) ×3 IMPLANT
NEEDLE HYPO 22GX1.5 SAFETY (NEEDLE) ×3 IMPLANT
NEEDLE HYPO 25X5/8 SAFETYGLIDE (NEEDLE) ×3 IMPLANT
NS IRRIG 1000ML POUR BTL (IV SOLUTION) ×3 IMPLANT
PACK C SECTION WH (CUSTOM PROCEDURE TRAY) ×3 IMPLANT
PAD OB MATERNITY 4.3X12.25 (PERSONAL CARE ITEMS) ×3 IMPLANT
PENCIL SMOKE EVAC W/HOLSTER (ELECTROSURGICAL) ×3 IMPLANT
RTRCTR C-SECT PINK 25CM LRG (MISCELLANEOUS) IMPLANT
SUT CHROMIC 0 CT 1 (SUTURE) ×3 IMPLANT
SUT MNCRL 0 VIOLET CTX 36 (SUTURE) ×3 IMPLANT
SUT MONOCRYL 0 CTX 36 (SUTURE) ×6
SUT PLAIN 2 0 (SUTURE)
SUT PLAIN 2 0 XLH (SUTURE) IMPLANT
SUT PLAIN ABS 2-0 CT1 27XMFL (SUTURE) IMPLANT
SUT VIC AB 0 CTX 36 (SUTURE) ×2
SUT VIC AB 0 CTX36XBRD ANBCTRL (SUTURE) ×1 IMPLANT
SUT VIC AB 4-0 KS 27 (SUTURE) IMPLANT
SYR 20CC LL (SYRINGE) ×6 IMPLANT
TOWEL OR 17X24 6PK STRL BLUE (TOWEL DISPOSABLE) ×3 IMPLANT
TRAY FOLEY CATH SILVER 14FR (SET/KITS/TRAYS/PACK) IMPLANT

## 2015-08-20 NOTE — Op Note (Signed)
Preoperative diagnosis:  1.  Intrauterine pregnancy at 3870w2d  weeks gestation                                         2.  PPROM                                         3.  Previous Caesarean section   Postoperative diagnosis:  Same as above   Procedure:  Repeat cesarean section  Surgeon:  Lazaro ArmsLuther H Eure MD  Assistant:    Anesthesia: Spinal  Findings:  .    Over a low transverse incision was delivered a viable female with Apgars of 7 and 7 and 8 weighing pending lbs.  oz. Uterus, tubes and ovaries were all normal.  There were no other significant findings  Description of operation:  Patient was taken to the operating room and placed in the sitting position where she underwent a spinal anesthetic. She was then placed in the supine position with tilt to the left side. When adequate anesthetic level was obtained she was prepped and draped in usual sterile fashion and a Foley catheter was placed. A Pfannenstiel skin incision was made and carried down sharply to the rectus fascia which was scored in the midline extended laterally. The fascia was taken off the muscles both superiorly and without difficulty. The muscles were divided.  The peritoneal cavity was entered.  Bladder blade was placed, no bladder flap was created.  A low transverse hysterotomy incision was made and delivered a viable female  infant at 2042 with Apgars of 7 and 7 and 8 weighing lbs  oz.  Cord pH was obtained and was pending. The uterus was exteriorized. It was closed in 2 layers, the first being a running interlocking layer and the second being an imbricating layer using 0 monocryl on a CTX needle. There was good resulting hemostasis. The uterus tubes and ovaries were all normal. Peritoneal cavity was irrigated vigorously. The muscles and peritoneum were reapproximated loosely. The fascia was closed using 0 Vicryl in running fashion. Subcutaneous tissue was made hemostatic and irrigated. The skin was closed using 4-0 Vicryl on a Keith  needle in a subcuticular fashion.  Dermabond was placed for additional wound integrity and to serve as a barrier. Blood loss for the procedure was 600 cc. The patient received a gram of Ancef prophylactically. The patient was taken to the recovery room in good stable condition with all counts being correct x3.  EBL 600 cc  EURE,LUTHER H 08/20/2015 9:11 PM

## 2015-08-20 NOTE — H&P (Signed)
Chief Complaint:  Vaginal Bleeding and Rupture of Membranes   None    HPI  HPI: [name] is a 35 y.o. [phone] at 59w2dwho presents to maternity admissions reporting leaking of fluid since 2 hours ago.  Fluid is brown in color and watery.  Some mild cramps.  Decreased but present movement. She reports good fetal movement, denies vaginal itching/burning, urinary symptoms, h/a, dizziness, n/v, diarrhea, constipation or fever/chills.  She denies headache, visual changes or RUQ abdominal pain.  Was admitted in April for third trimester bleeding. Did get Magnesium Sulfate and 2 doses of steroids (08/02/15. Previously had previa but it resolved on 07/31/15.  RN Note: Pt states she started to have abd cramping. Went to bathroom and states she has a lot of brown fluid with a little bit of blood coming from her vagina. Continues to leak out. Fetal movement but not as much as yesterday.         Past Medical History: Past Medical History  Diagnosis Date  . Thyroid disease     hypothyroid  . Hypothyroidism   . Mental disorder     Post partum depression/anxiety  . Herpes simplex     diagnosed in [place], no recent breakouts    Past obstetric history: OB History  Gravida Para Term Preterm AB SAB TAB Ectopic Multiple Living  0 0 0 1    # Outcome Date GA Lbr Len/2nd Weight Sex Delivery Anes PTL Lv  6 Current           5 TAB           4 SAB           3 SAB           2 SAB           1 Term             Obstetric Comments  3 spontaneous abortions all at [redacted] weeks gestation  Last pregnancy 5 years ago (daughter born at 71 weeks)    Past Surgical History: Past Surgical History  Procedure Laterality Date  . Cholecystectomy    . Cesarean section      Family History: History reviewed. No pertinent family history.  Social History: Social History  Substance Use Topics  . Smoking status: Former Smoker -- 0.75 packs/day for .1 years    Types: Cigarettes  .  Smokeless tobacco: Never Used  . Alcohol Use: No    Allergies: No Known Allergies  Meds:  Prescriptions prior to admission  Medication Sig Dispense Refill Last Dose  . acyclovir (ZOVIRAX) 400 MG tablet Take 1 tablet (400 mg total) by mouth 2 (two) times daily. 60 tablet 3 08/20/2015 at Unknown time  . folic acid (FOLVITE) 1 MG tablet Take 1 mg by mouth daily.   08/20/2015 at Unknown time  . Prenatal Vit-Fe Fumarate-FA (PRENATAL MULTIVITAMIN) TABS tablet Take 1 tablet by mouth daily at 12 noon.   08/20/2015 at Unknown time    I have reviewed patient's Past Medical Hx, Surgical Hx, Family Hx, Social Hx, medications and allergies.   ROS:  Review of Systems  Constitutional: Negative for fever and chills.  Respiratory: Negative for shortness of breath.   Gastrointestinal: Negative for nausea, vomiting, abdominal pain, diarrhea and constipation.  Genitourinary: Positive for vaginal bleeding. Negative for dysuria, vaginal discharge, vaginal pain and pelvic pain.  Musculoskeletal: Negative for back pain.  Neurological: Negative for dizziness.   Other systems negative  Physical Exam  Patient Vitals for the past 24 hrs:  BP Temp Temp src Pulse Resp SpO2 Height Weight  08/20/15 1618 114/70 mmHg 98.3 F (36.8 C) Oral 78 18 99 % 5' 1.25" (1.556 m) 143 lb 12.8 oz (65.227 kg)   Constitutional: Well-developed, well-nourished female in no acute distress.  Cardiovascular: normal rate and rhythm Respiratory: normal effort, clear to auscultation bilaterally GI: Abd soft, non-tender, gravid appropriate for gestational age.   No rebound or guarding. MS: Extremities nontender, no edema, normal ROM Neurologic: Alert and oriented x 4.  GU: Neg CVAT.  PELVIC EXAM: Cervix pink, visually closed, without lesion, small amount reddish brown watery discharge, vaginal walls and external genitalia normal Bimanual exam:  Deferred, as I thought she had a previa.  Appears closed  + pooling, + ferning  FHT:   Baseline 140 , moderate variability, accelerations present, no decelerations Contractions:  Irregular    Labs: No results found for this or any previous visit (from the past 24 hour(s)).  Imaging:  Latest US in April showed resolved previa, posterior placenta  MAU Course/MDM: I have ordered labs and reviewed results.  NST reviewed Consult Dr Jolayne Pantheronstant with presentation, exam findings and test results.  Will admit for observation. WIll order antibiotics. Possible C/S tonight   Assessment: SIUP at 7365w2d  PPROM Hx chronic abruption  Plan: Admit to Antenatal or OR per Dr Despina HiddenEure    Medication List    ASK your doctor about these medications        acyclovir 400 MG tablet  Commonly known as:  ZOVIRAX  Take 1 tablet (400 mg total) by mouth 2 (two) times daily.     folic acid 1 MG tablet  Commonly known as:  FOLVITE  Take 1 mg by mouth daily.     prenatal multivitamin Tabs tablet  Take 1 tablet by mouth daily at 12 noon.       Pt stable at time of discharge.  [name] CNM, MSN Certified Nurse-Midwife 08/20/2015 4:41 PM

## 2015-08-20 NOTE — H&P (Signed)
HPI  HPI: [name] is a 35 y.o. [phone] at 55w2dwho presents to maternity admissions reporting leaking of fluid since 2 hours ago. Fluid is brown in color and watery. Some mild cramps. Decreased but present movement. She reports good fetal movement, denies vaginal itching/burning, urinary symptoms, h/a, dizziness, n/v, diarrhea, constipation or fever/chills. She denies headache, visual changes or RUQ abdominal pain.  Was admitted in April for third trimester bleeding. Did get Magnesium Sulfate and 2 doses of steroids (08/02/15. Previously had previa but it resolved on 07/31/15.  RN Note: Pt states she started to have abd cramping. Went to bathroom and states she has a lot of brown fluid with a little bit of blood coming from her vagina. Continues to leak out. Fetal movement but not as much as yesterday.         Past Medical History: Past Medical History  Diagnosis Date  . Thyroid disease     hypothyroid  . Hypothyroidism   . Mental disorder     Post partum depression/anxiety  . Herpes simplex     diagnosed in [place], no recent breakouts    Past obstetric history: OB History  Gravida Para Term Preterm AB SAB TAB Ectopic Multiple Living  0 0 0 1    # Outcome Date GA Lbr Len/2nd Weight Sex Delivery Anes PTL Lv  6 Current           5 TAB           4 SAB           3 SAB           2 SAB           1 Term             Obstetric Comments  3 spontaneous abortions all at [redacted] weeks gestation  Last pregnancy 5 years ago (daughter born at 34 weeks)    Past Surgical History: Past Surgical History  Procedure Laterality Date  . Cholecystectomy    . Cesarean section      Family History: History reviewed. No pertinent family history.  Social History: Social History  Substance Use Topics  . Smoking status:  Former Smoker -- 0.75 packs/day for .1 years    Types: Cigarettes  . Smokeless tobacco: Never Used  . Alcohol Use: No    Allergies: No Known Allergies  Meds:  Prescriptions prior to admission  Medication Sig Dispense Refill Last Dose  . acyclovir (ZOVIRAX) 400 MG tablet Take 1 tablet (400 mg total) by mouth 2 (two) times daily. 60 tablet 3 08/20/2015 at Unknown time  . folic acid (FOLVITE) 1 MG tablet Take 1 mg by mouth daily.   08/20/2015 at Unknown time  . Prenatal Vit-Fe Fumarate-FA (PRENATAL MULTIVITAMIN) TABS tablet Take 1 tablet by mouth daily at 12 noon.   08/20/2015 at Unknown time    I have reviewed patient's Past Medical Hx, Surgical Hx, Family Hx, Social Hx, medications and allergies.   ROS:  Review of Systems  Constitutional: Negative for fever and chills.  Respiratory: Negative for shortness of breath.  Gastrointestinal: Negative for nausea, vomiting, abdominal pain, diarrhea and constipation.  Genitourinary: Positive for vaginal bleeding. Negative for dysuria, vaginal discharge, vaginal pain and pelvic pain.  Musculoskeletal: Negative for back pain.  Neurological: Negative for dizziness.   Other systems negative  Physical Exam  Patient Vitals for the past 24 hrs:  BP Temp Temp src Pulse Resp SpO2 Height  Weight  08/20/15 1618 114/70 mmHg 98.3 F (36.8 C) Oral 78 18 99 % 5' 1.25" (1.556 m) 143 lb 12.8 oz (65.227 kg)   Constitutional: Well-developed, well-nourished female in no acute distress.  Cardiovascular: normal rate and rhythm Respiratory: normal effort, clear to auscultation bilaterally GI: Abd soft, non-tender, gravid appropriate for gestational age. No rebound or guarding. MS: Extremities nontender, no edema, normal ROM Neurologic: Alert and oriented x 4.  GU: Neg CVAT.  PELVIC EXAM: Cervix pink, visually closed, without lesion, small amount reddish brown watery discharge, vaginal walls and  external genitalia normal Bimanual exam: Deferred, as I thought she had a previa. Appears closed  + pooling, + ferning  FHT: Baseline 140 , moderate variability, accelerations present, no decelerations Contractions: Irregular   Labs: No results found for this or any previous visit (from the past 24 hour(s)).  Imaging:  Latest US in April showed resolved previa, posterior placenta  MAU Course/MDM: I have ordered labs and reviewed results.  NST reviewed Consult Dr Jolayne Pantheronstant with presentation, exam findings and test results.  Will admit for observation. WIll order antibiotics. Possible C/S tonight   Assessment: SIUP at 3666w2d  PPROM Hx chronic abruption  Plan: Pt received a course(2 doses of steroids) 3 weeks prior so will proceed with a repeat Caesraean section based on anesthesia timing Pt aware and agrees with the management plan    Medication List    ASK your doctor about these medications       acyclovir 400 MG tablet  Commonly known as: ZOVIRAX  Take 1 tablet (400 mg total) by mouth 2 (two) times daily.     folic acid 1 MG tablet  Commonly known as: FOLVITE  Take 1 mg by mouth daily.     prenatal multivitamin Tabs tablet  Take 1 tablet by mouth daily at 12 noon.       Pt stable at time of discharge.  Wynelle BourgeoisMarie Williams CNM, MSN Certified Nurse-Midwife 08/20/2015 4:41 PM              Lazaro ArmsEURE,LUTHER H, MD 08/20/2015 7:45 PM

## 2015-08-20 NOTE — Consult Note (Signed)
[facility]  Delivery Note:  C-section       08/20/2015  9:11 PM  I was called to the operating room at the request of the patient's obstetrician (Dr. Despina HiddenEure) for a repeat c-section at 34 weeks.    PRENATAL HX:  This is a 35 y/o G6P1041 at 5934 and 2/[redacted] weeks gestation who was admitted for PROM 2 hours prior to admission.  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  She had placenta previa earlier in the pregnancy but it resolved.  She was admitted in April for 3rd trimester bleeding for which she received Magnesium and BMZ x2.    DELIVERY:  Infant was vigorous at delivery, initially requiring no resuscitation other than standard warming, drying and stimulation.  O2 saturation monitor applied and at 3 minutes saturations were in the 50s.  CPAP +5 was administered, initially with 60% FiO2 though this was quickly weaned to 30%.  Infant began grunting even with CPAP administration at about 5 mintues of age.  APGARs 7, 7 and 8.  Exam notable for tachypnea and mild retractions with CPAP, but otherwise was within normal limits.  Will admit to NICU due to gestational age and need for CPAP.    _____________________ Electronically Signed By: Maryan CharLindsey Keishawna Carranza, MD Neonatologist

## 2015-08-20 NOTE — Transfer of Care (Signed)
Immediate Anesthesia Transfer of Care Note  Patient: [name] AgeeErika Corona-Moreno  Procedure(s) Performed: Procedure(s): CESAREAN SECTION (N/A)  Patient Location: PACU  Anesthesia Type:Spinal  Level of Consciousness: awake  Airway & Oxygen Therapy: Patient Spontanous Breathing  Post-op Assessment: Report given to RN  Post vital signs: Reviewed and stable  Last Vitals:  Filed Vitals:   08/20/15 1618  BP: 114/70  Pulse: 78  Temp: 36.8 C  Resp: 18    Last Pain: There were no vitals filed for this visit.       Complications: No apparent anesthesia complications

## 2015-08-20 NOTE — MAU Note (Signed)
Pt states she started to have abd cramping.  Went to bathroom and states she has a lot of brown fluid with a little bit of blood coming from her vagina.  Continues to leak out.  Fetal movement but not as much as yesterday.

## 2015-08-20 NOTE — Anesthesia Postprocedure Evaluation (Signed)
Anesthesia Post Note  Patient: [name]  Procedure(s) Performed: Procedure(s) (LRB): CESAREAN SECTION (N/A)  Patient location during evaluation: PACU Anesthesia Type: General Level of consciousness: awake and alert and oriented Pain management: pain level controlled Vital Signs Assessment: post-procedure vital signs reviewed and stable Respiratory status: spontaneous breathing, nonlabored ventilation and respiratory function stable Cardiovascular status: blood pressure returned to baseline and stable Postop Assessment: no signs of nausea or vomiting Anesthetic complications: no     Last Vitals:  Filed Vitals:   08/20/15 2315 08/20/15 2330  BP: 105/74   Pulse: 56 58  Temp:    Resp: 14 13    Last Pain:  Filed Vitals:   08/20/15 2331  PainSc: 0-No pain   Pain Goal:                 [name] A.

## 2015-08-20 NOTE — Anesthesia Procedure Notes (Signed)
Spinal Patient location during procedure: OR Start time: 08/20/2015 8:16 PM Staffing Anesthesiologist: Mal AmabileFOSTER, MICHAEL Performed by: anesthesiologist  Preanesthetic Checklist Completed: patient identified, site marked, surgical consent, pre-op evaluation, timeout performed, IV checked, risks and benefits discussed and monitors and equipment checked Spinal Block Patient position: sitting Prep: DuraPrep Patient monitoring: heart rate, cardiac monitor, continuous pulse ox and blood pressure Approach: midline Location: L3-4 Injection technique: single-shot Needle Needle type: Pencan  Needle gauge: 24 G Needle length: 9 cm Needle insertion depth: 5 cm Assessment Sensory level: T4 Additional Notes Patient tolerated procedure well. Adequate sensory level.

## 2015-08-20 NOTE — Anesthesia Preprocedure Evaluation (Signed)
Anesthesia Evaluation  Patient identified by MRN, date of birth, ID band Patient awake    Reviewed: Allergy & Precautions, NPO status , Patient's Chart, lab work & pertinent test results  Airway Mallampati: II  TM Distance: >3 FB Neck ROM: Full    Dental no notable dental hx. (+) Teeth Intact   Pulmonary former smoker,    Pulmonary exam normal breath sounds clear to auscultation       Cardiovascular negative cardio ROS Normal cardiovascular exam Rhythm:Regular Rate:Normal     Neuro/Psych PSYCHIATRIC DISORDERS negative neurological ROS     GI/Hepatic negative GI ROS, Neg liver ROS,   Endo/Other  Hypothyroidism   Renal/GU negative Renal ROS  negative genitourinary   Musculoskeletal negative musculoskeletal ROS (+)   Abdominal (+) - obese,   Peds  Hematology negative hematology ROS (+)   Anesthesia Other Findings   Reproductive/Obstetrics (+) Pregnancy Previous C/Section HSV PPROM                             Anesthesia Physical Anesthesia Plan  ASA: II and emergent  Anesthesia Plan: Spinal   Post-op Pain Management:    Induction:   Airway Management Planned: Natural Airway and Nasal Cannula  Additional Equipment:   Intra-op Plan:   Post-operative Plan:   Informed Consent: I have reviewed the patients History and Physical, chart, labs and discussed the procedure including the risks, benefits and alternatives for the proposed anesthesia with the patient or authorized representative who has indicated his/her understanding and acceptance.   Dental advisory given  Plan Discussed with: CRNA, Anesthesiologist and Surgeon  Anesthesia Plan Comments:         Anesthesia Quick Evaluation

## 2015-08-20 NOTE — MAU Provider Note (Signed)
Chief Complaint:  Vaginal Bleeding and Rupture of Membranes   None    HPI  HPI: [name] is a 35 y.o. G6P1041 at 34w2dwho presents to maternity admissions reporting leaking of fluid since 2 hours ago.  Fluid is brown in color and watery.  Some mild cramps.  Decreased but present movement. She reports good fetal movement, denies vaginal itching/burning, urinary symptoms, h/a, dizziness, n/v, diarrhea, constipation or fever/chills.  She denies headache, visual changes or RUQ abdominal pain.  Was admitted in April for third trimester bleeding. Did get Magnesium Sulfate and 2 doses of steroids (08/02/15. Previously had previa but it resolved on 07/31/15.  RN Note: Pt states she started to have abd cramping. Went to bathroom and states she has a lot of brown fluid with a little bit of blood coming from her vagina. Continues to leak out. Fetal movement but not as much as yesterday.         Past Medical History: Past Medical History  Diagnosis Date  . Thyroid disease     hypothyroid  . Hypothyroidism   . Mental disorder     Post partum depression/anxiety  . Herpes simplex     diagnosed in [place], no recent breakouts    Past obstetric history: OB History  Gravida Para Term Preterm AB SAB TAB Ectopic Multiple Living  6 1 1 0 4 3 1 0 0 1    # Outcome Date GA Lbr Len/2nd Weight Sex Delivery Anes PTL Lv  6 Current           5 TAB           4 SAB           3 SAB           2 SAB           1 Term             Obstetric Comments  3 spontaneous abortions all at [redacted] weeks gestation  Last pregnancy 5 years ago (daughter born at 37 weeks)    Past Surgical History: Past Surgical History  Procedure Laterality Date  . Cholecystectomy    . Cesarean section      Family History: History reviewed. No pertinent family history.  Social History: Social History  Substance Use Topics  . Smoking status: Former Smoker -- 0.75 packs/day for .1 years    Types: Cigarettes  .  Smokeless tobacco: Never Used  . Alcohol Use: No    Allergies: No Known Allergies  Meds:  Prescriptions prior to admission  Medication Sig Dispense Refill Last Dose  . acyclovir (ZOVIRAX) 400 MG tablet Take 1 tablet (400 mg total) by mouth 2 (two) times daily. 60 tablet 3 08/20/2015 at Unknown time  . folic acid (FOLVITE) 1 MG tablet Take 1 mg by mouth daily.   08/20/2015 at Unknown time  . Prenatal Vit-Fe Fumarate-FA (PRENATAL MULTIVITAMIN) TABS tablet Take 1 tablet by mouth daily at 12 noon.   08/20/2015 at Unknown time    I have reviewed patient's Past Medical Hx, Surgical Hx, Family Hx, Social Hx, medications and allergies.   ROS:  Review of Systems  Constitutional: Negative for fever and chills.  Respiratory: Negative for shortness of breath.   Gastrointestinal: Negative for nausea, vomiting, abdominal pain, diarrhea and constipation.  Genitourinary: Positive for vaginal bleeding. Negative for dysuria, vaginal discharge, vaginal pain and pelvic pain.  Musculoskeletal: Negative for back pain.  Neurological: Negative for dizziness.   Other systems negative    Physical Exam  Patient Vitals for the past 24 hrs:  BP Temp Temp src Pulse Resp SpO2 Height Weight  08/20/15 1618 114/70 mmHg 98.3 F (36.8 C) Oral 78 18 99 % 5' 1.25" (1.556 m) 143 lb 12.8 oz (65.227 kg)   Constitutional: Well-developed, well-nourished female in no acute distress.  Cardiovascular: normal rate and rhythm Respiratory: normal effort, clear to auscultation bilaterally GI: Abd soft, non-tender, gravid appropriate for gestational age.   No rebound or guarding. MS: Extremities nontender, no edema, normal ROM Neurologic: Alert and oriented x 4.  GU: Neg CVAT.  PELVIC EXAM: Cervix pink, visually closed, without lesion, small amount reddish brown watery discharge, vaginal walls and external genitalia normal Bimanual exam:  Deferred, as I thought she had a previa.  Appears closed  + pooling, + ferning  FHT:   Baseline 140 , moderate variability, accelerations present, no decelerations Contractions:  Irregular    Labs: No results found for this or any previous visit (from the past 24 hour(s)).  Imaging:  Latest US in April showed resolved previa, posterior placenta  MAU Course/MDM: I have ordered labs and reviewed results.  NST reviewed Consult Dr Constant with presentation, exam findings and test results.  Will admit for observation. WIll order antibiotics. Possible C/S tonight   Assessment: SIUP at [redacted]w[redacted]d  PPROM Hx chronic abruption  Plan: Admit to Antenatal or OR per Dr Eure    Medication List    ASK your doctor about these medications        acyclovir 400 MG tablet  Commonly known as:  ZOVIRAX  Take 1 tablet (400 mg total) by mouth 2 (two) times daily.     folic acid 1 MG tablet  Commonly known as:  FOLVITE  Take 1 mg by mouth daily.     prenatal multivitamin Tabs tablet  Take 1 tablet by mouth daily at 12 noon.       Pt stable at time of discharge.  [name] CNM, MSN Certified Nurse-Midwife 08/20/2015 4:41 PM   

## 2015-08-21 ENCOUNTER — Encounter (HOSPITAL_COMMUNITY): Payer: Self-pay | Admitting: *Deleted

## 2015-08-21 DIAGNOSIS — Z98891 History of uterine scar from previous surgery: Secondary | ICD-10-CM

## 2015-08-21 LAB — CBC
HCT: 33.9 % — ABNORMAL LOW (ref 36.0–46.0)
Hemoglobin: 11.9 g/dL — ABNORMAL LOW (ref 12.0–15.0)
MCH: 29.9 pg (ref 26.0–34.0)
MCHC: 35.1 g/dL (ref 30.0–36.0)
MCV: 85.2 fL (ref 78.0–100.0)
PLATELETS: 144 10*3/uL — AB (ref 150–400)
RBC: 3.98 MIL/uL (ref 3.87–5.11)
RDW: 14.1 % (ref 11.5–15.5)
WBC: 10 10*3/uL (ref 4.0–10.5)

## 2015-08-21 MED ORDER — TETANUS-DIPHTH-ACELL PERTUSSIS 5-2.5-18.5 LF-MCG/0.5 IM SUSP
0.5000 mL | Freq: Once | INTRAMUSCULAR | Status: AC
  Administered 2015-08-21: 0.5 mL via INTRAMUSCULAR
  Filled 2015-08-21: qty 0.5

## 2015-08-21 MED ORDER — ACETAMINOPHEN 325 MG PO TABS: 650.0000 mg | ORAL_TABLET | ORAL | Status: DC | PRN

## 2015-08-21 MED ORDER — SIMETHICONE 80 MG PO CHEW: 80.0000 mg | CHEWABLE_TABLET | ORAL | Status: DC | PRN

## 2015-08-21 MED ORDER — WITCH HAZEL-GLYCERIN EX PADS: 1.0000 "application " | MEDICATED_PAD | CUTANEOUS | Status: DC | PRN

## 2015-08-21 MED ORDER — PRENATAL MULTIVITAMIN CH
1.0000 | ORAL_TABLET | Freq: Every day | ORAL | Status: DC
  Administered 2015-08-22: 1 via ORAL
  Filled 2015-08-21 (×2): qty 1

## 2015-08-21 MED ORDER — SIMETHICONE 80 MG PO CHEW
80.0000 mg | CHEWABLE_TABLET | ORAL | Status: DC
  Administered 2015-08-21: 80 mg via ORAL
  Filled 2015-08-21: qty 1

## 2015-08-21 MED ORDER — OXYTOCIN 10 UNIT/ML IJ SOLN: 2.5000 [IU]/h | INTRAVENOUS | Status: AC

## 2015-08-21 MED ORDER — PRENATAL MULTIVITAMIN CH
1.0000 | ORAL_TABLET | Freq: Every day | ORAL | Status: DC
  Administered 2015-08-21: 1 via ORAL

## 2015-08-21 MED ORDER — LACTATED RINGERS IV SOLN
INTRAVENOUS | Status: DC
  Administered 2015-08-21: 125 mL/h via INTRAVENOUS

## 2015-08-21 MED ORDER — COCONUT OIL OIL: 1.0000 "application " | TOPICAL_OIL | Status: DC | PRN

## 2015-08-21 MED ORDER — ZOLPIDEM TARTRATE 5 MG PO TABS: 5.0000 mg | ORAL_TABLET | Freq: Every evening | ORAL | Status: DC | PRN

## 2015-08-21 MED ORDER — DIPHENHYDRAMINE HCL 25 MG PO CAPS: 25.0000 mg | ORAL_CAPSULE | Freq: Four times a day (QID) | ORAL | Status: DC | PRN

## 2015-08-21 MED ORDER — MENTHOL 3 MG MT LOZG: 1.0000 | LOZENGE | OROMUCOSAL | Status: DC | PRN

## 2015-08-21 MED ORDER — DIBUCAINE 1 % RE OINT: 1.0000 "application " | TOPICAL_OINTMENT | RECTAL | Status: DC | PRN

## 2015-08-21 MED ORDER — SENNOSIDES-DOCUSATE SODIUM 8.6-50 MG PO TABS
2.0000 | ORAL_TABLET | ORAL | Status: DC
  Administered 2015-08-21: 2 via ORAL
  Filled 2015-08-21: qty 2

## 2015-08-21 MED ORDER — SIMETHICONE 80 MG PO CHEW
80.0000 mg | CHEWABLE_TABLET | Freq: Three times a day (TID) | ORAL | Status: DC
  Administered 2015-08-21 – 2015-08-22 (×4): 80 mg via ORAL
  Filled 2015-08-21 (×3): qty 1

## 2015-08-21 NOTE — Lactation Note (Signed)
This note was copied from a baby's chart. Lactation Consultation Note  Initial visit made.   Providing Breastmilk For Your Baby In NICU booklet in Spanish given to patient.  Mom states she breastfed her first baby for one year and desires to do the same with new baby.  Mom has started pumping.  Instructed on hand expression and drops obtained.  Assisted with pumping and mom understands how to use pump on initiation setting.  Instructed to pump every 3 hours for 15 minutes followed by hand expression.  [facility] discussed.  Encouraged to call for assist/concerns prn.  Patient Name: Krystal Janeece Ageerika [name] ZOXWR'UToday's Date: 08/21/2015 Reason for consult: Initial assessment;NICU baby   Maternal Data Has patient been taught Hand Expression?: Yes Does the patient have breastfeeding experience prior to this delivery?: Yes  Feeding Feeding Type: Formula Length of feed: 30 min  LATCH Score/Interventions                      Lactation Tools Discussed/Used WIC Program: No Pump Review: Setup, frequency, and cleaning;Milk Storage Initiated by:: RN Date initiated:: 08/21/15   Consult Status Consult Status: Follow-up Date: 08/22/15 Follow-up type: In-patient    Krystal FoleyMOULDEN, Nikira Kushnir S 08/21/2015, 2:13 PM

## 2015-08-21 NOTE — Anesthesia Postprocedure Evaluation (Signed)
Anesthesia Post Note  Patient: [name]  Procedure(s) Performed: Procedure(s) (LRB): CESAREAN SECTION (N/A)  Patient location during evaluation: Mother Baby Anesthesia Type: Spinal Level of consciousness: awake and alert and oriented Pain management: satisfactory to patient Vital Signs Assessment: post-procedure vital signs reviewed and stable Respiratory status: respiratory function stable and spontaneous breathing Cardiovascular status: blood pressure returned to baseline Postop Assessment: no headache, no backache, spinal receding, patient able to bend at knees and adequate PO intake Anesthetic complications: no     Last Vitals:  Filed Vitals:   08/21/15 0600 08/21/15 0605  BP: 95/55 88/66  Pulse: 57 63  Temp: 36.4 C   Resp: 18     Last Pain:  Filed Vitals:   08/21/15 0616  PainSc: 0-No pain   Pain Goal: Patients Stated Pain Goal: 3 (08/20/15 2345)               [name]

## 2015-08-21 NOTE — Progress Notes (Signed)
Subjective: Postpartum Day 1: Cesarean Delivery Patient reports nausea, vomiting, incisional pain and tolerating PO.    Objective: Vital signs in last 24 hours: Temp:  [97.6 F (36.4 C)-98.3 F (36.8 C)] 97.6 F (36.4 C) (05/04 0600) Pulse Rate:  [54-78] 63 (05/04 0605) Resp:  [13-18] 18 (05/04 0600) BP: (88-114)/(39-75) 88/66 mmHg (05/04 0605) SpO2:  [94 %-99 %] 97 % (05/04 0600) Weight:  [143 lb 12.8 oz (65.227 kg)] 143 lb 12.8 oz (65.227 kg) (05/03 1618)  Physical Exam:  General: alert, cooperative and no distress Lochia: appropriate Uterine Fundus: firm Incision: no significant drainage, dressing dry DVT Evaluation: No evidence of DVT seen on physical exam.   Recent Labs  08/20/15 1718 08/21/15 0540  HGB 11.7* 11.9*  HCT 33.7* 33.9*    Assessment/Plan: Status post Cesarean section. Doing well postoperatively.  Continue current care.  EURE,LUTHER H 08/21/2015, 7:27 AM

## 2015-08-21 NOTE — Addendum Note (Signed)
Addendum  created 08/21/15 [id number] by Graciela HusbandsWynn O Brynnleigh Mcelwee, CRNA   Modules edited: Notes Section   Notes Section:  File: [id number]

## 2015-08-21 NOTE — Progress Notes (Signed)
CSW acknowledges NICU admission.    Patient screened out for psychosocial assessment since none of the following apply:  Psychosocial stressors documented in mother or baby's chart  Gestation less than 32 weeks  Code at delivery   Infant with anomalies  Please contact the Clinical Social Worker if specific needs arise, or by MOB's request.       

## 2015-08-22 ENCOUNTER — Other Ambulatory Visit: Payer: Self-pay

## 2015-08-22 ENCOUNTER — Ambulatory Visit (HOSPITAL_COMMUNITY): Payer: MEDICAID

## 2015-08-22 MED ORDER — OXYCODONE-ACETAMINOPHEN 5-325 MG PO TABS: 1.0000 | ORAL_TABLET | Freq: Four times a day (QID) | ORAL | Status: DC | PRN

## 2015-08-22 MED ORDER — IBUPROFEN 600 MG PO TABS: 600.0000 mg | ORAL_TABLET | Freq: Four times a day (QID) | ORAL | Status: DC

## 2015-08-22 NOTE — Lactation Note (Signed)
This note was copied from a baby's chart. Lactation Consultation Note  Patient Name: Krystal Janeece Ageerika [name] UXLKG'MToday's Date: 08/22/2015 Reason for consult: Follow-up assessment;NICU baby;Pump rental   DEBP rented with instructions for use and return.   Maternal Data Has patient been taught Hand Expression?: Yes Does the patient have breastfeeding experience prior to this delivery?: Yes  Feeding Feeding Type: Formula Length of feed: 30 min  LATCH Score/Interventions                      Lactation Tools Discussed/Used WIC Program: No Pump Review: Setup, frequency, and cleaning;Milk Storage   Consult Status Consult Status: PRN Follow-up type: Call as needed    Ed BlalockSharon S Hice 08/22/2015, 1:39 PM

## 2015-08-22 NOTE — Lactation Note (Signed)
This note was copied from a baby's chart. Lactation Consultation Note  Patient Name: [name] ZOXWR'UToday's Date: 08/22/2015 Reason for consult: Follow-up assessment;NICU baby;Infant < 6lbs;Late preterm infant   Follow up with mom who just finished pumping. She received a few gtts via pumping. Showed her how to hand express and she received more large gtts. She was able to return demonstration. Pump rental paperwork given with my phone # for mom to call me when she is ready for pump rental.   Maternal Data Has patient been taught Hand Expression?: Yes Does the patient have breastfeeding experience prior to this delivery?: Yes  Feeding Feeding Type: Formula Length of feed: 30 min  LATCH Score/Interventions                      Lactation Tools Discussed/Used WIC Program: No Pump Review: Setup, frequency, and cleaning;Milk Storage   Consult Status Consult Status: PRN Follow-up type: Call as needed    Ed BlalockSharon S Sotero Brinkmeyer 08/22/2015, 11:48 AM

## 2015-08-22 NOTE — Lactation Note (Signed)
This note was copied from a baby's chart. Lactation Consultation Note  Patient Name: [name] ZOXWR'UToday's Date: 08/22/2015 Reason for consult: Follow-up assessment;NICU baby;Infant < 6lbs;Late preterm infant   Follow up with mom of 2538 hour old NICU infant in NICU. Infant on NCPAP currently.  Mom reports she is pumping every 3 hours and is getting gtts of colostrum. She reports she knows how to hand express, but has not been doing so. Enc her to pump every 2-3 3 hours with DEBP on Initiate setting for 15 minutes followed by hand expression She reports she does not have a pump at home and wishes to rent a pump. Will give pump rental paperwork and follow op with pump rental with prior to mom's d/c.     Maternal Data    Feeding Feeding Type: Formula Length of feed: 30 min  LATCH Score/Interventions                      Lactation Tools Discussed/Used WIC Program: No Pump Review: Setup, frequency, and cleaning;Milk Storage   Consult Status Consult Status: PRN Follow-up type: Call as needed    Ed BlalockSharon S Hice 08/22/2015, 11:04 AM

## 2015-08-22 NOTE — Discharge Summary (Signed)
OB Discharge Summary     Patient Name: [name] DOB: 1981/03/10 MRN: [id number]  Date of admission: 08/20/2015 Delivering MD: Duane Lope H   Date of discharge: 08/22/2015  Admitting diagnosis: 34wks bleeding Intrauterine pregnancy: [redacted]w[redacted]d     Secondary diagnosis:  Active Problems:   Preterm premature rupture of membranes (PPROM) with unknown onset of labor   S/P cesarean section  Additional problems: none     Discharge diagnosis: Preterm Pregnancy Delivered                                                                                                Post partum procedures: none  Complications: None  Hospital course:  Onset of Labor With Unplanned C/S  35 y.o. yo [id number] at [redacted]w[redacted]d was admitted in Latent Labor on 08/20/2015. Patient had a labor course significant for PROM. Membrane Rupture Time/Date: 5:40 PM ,08/20/2015   The patient went for cesarean section due to prev c-section.  declined TOL, and delivered a Viable infant,08/20/2015  Details of operation can be found in separate operative note. Patient had an uncomplicated postpartum course.  She is ambulating,tolerating a regular diet, passing flatus, and urinating well.  Patient is discharged home in stable condition 08/22/2015.  Physical exam  Filed Vitals:   08/21/15 1100 08/21/15 1400 08/21/15 1757 08/21/15 2112  BP: 102/53 95/49 103/57 95/56  Pulse: 58 55 60 62  Temp:  98.2 F (36.8 C) 98.2 F (36.8 C) 98.6 F (37 C)  TempSrc:  Oral Oral Oral  Resp: Height:      Weight:      SpO2: 97% 96% 98% 98%   General: alert, cooperative and no distress Lochia: appropriate Uterine Fundus: firm Incision: Healing well with no significant drainage DVT Evaluation: No evidence of DVT seen on physical exam. Labs: Lab Results  Component Value Date   WBC 10.0 08/21/2015   HGB 11.9* 08/21/2015   HCT 33.9* 08/21/2015   MCV 85.2 08/21/2015   PLT 144* 08/21/2015   CMP Latest Ref Rng 03/11/2010  Glucose  70-99 mg/dL 78  BUN 1-30 mg/dL 12  Creatinine 8.65-7.84 mg/dL 6.96  Sodium 295-284 meq/L 140  Potassium 3.5-5.3 meq/L 4.1  Chloride 96-112 meq/L 105  CO2 19-32 meq/L 23  Calcium 8.4-10.5 mg/dL 9.5  Total Protein 1.3-2.4 g/dL 7.3  Total Bilirubin 4.0-1.0 mg/dL 0.4  Alkaline Phos 27-253 units/L 139(H)  AST 0-37 units/L 19  ALT 0-35 units/L 25    Discharge instruction: per After Visit Summary and "Baby and Me Booklet".  After visit meds:    Medication List    STOP taking these medications        acyclovir 400 MG tablet  Commonly known as:  ZOVIRAX      TAKE these medications        folic acid 1 MG tablet  Commonly known as:  FOLVITE  Take 1 mg by mouth daily.     ibuprofen 600 MG tablet  Commonly known as:  ADVIL,MOTRIN  Take 1 tablet (600 mg total) by mouth every 6 (six) hours.  oxyCODONE-acetaminophen 5-325 MG tablet  Commonly known as:  PERCOCET/ROXICET  Take 1-2 tablets by mouth every 6 (six) hours as needed.     prenatal multivitamin Tabs tablet  Take 1 tablet by mouth daily at 12 noon.        Diet: routine diet  Activity: Advance as tolerated. Pelvic rest for 6 weeks.   Outpatient follow up:2 weeks Follow up Appt:Future Appointments Date Time Provider Department Center  08/22/2015 9:30 AM [facility] US 5 [facility]   Follow up Visit:No Follow-up on file.  Postpartum contraception: IUD Mirena  Newborn Data: Live born female  Birth Weight: 5 lb 4 oz (2380 g) APGAR: 7, 7  Baby Feeding: Breast Disposition:NICU   08/22/2015 Willodean RosenthalHARRAWAY-SMITH, Zayneb Baucum, MD

## 2015-08-22 NOTE — Lactation Note (Signed)
This note was copied from a baby's chart. Lactation Consultation Note  Patient Name: [name] [name] ZOXWR'UToday's Date: 08/22/2015 Reason for consult: Follow-up assessment;NICU baby;Infant < 6lbs;Late preterm infant   Followed up with mom with assistance of Eda, hospital interpreter. Mom without questions/concerns with BF or pumping. Engorgement prevention/treatment reviewed with mom.    Maternal Data Has patient been taught Hand Expression?: Yes Does the patient have breastfeeding experience prior to this delivery?: Yes  Feeding Feeding Type: Formula Length of feed: 30 min  LATCH Score/Interventions                      Lactation Tools Discussed/Used WIC Program: No Pump Review: Setup, frequency, and cleaning;Milk Storage   Consult Status Consult Status: PRN Follow-up type: Call as needed    Ed BlalockSharon S Achaia Garlock 08/22/2015, 12:11 PM

## 2015-08-22 NOTE — Discharge Instructions (Signed)
Parto por cesrea - Cuidados posteriores  (Cesarean Delivery, Care After) Siga estas instrucciones durante las prximas semanas. Estas indicaciones le proporcionan informacin general acerca de cmo deber cuidarse despus del procedimiento. El mdico tambin podr darle instrucciones ms especficas. El tratamiento se ha planificado de acuerdo a las prcticas mdicas actuales, pero a veces se producen problemas. Comunquese con el mdico si tiene algn problema o tiene dudas cuando vuelva a su casa.  INSTRUCCIONES PARA EL CUIDADO EN EL HOGAR  Tome slo medicamentos de venta libre o recetados, segn las indicaciones del mdico.  No beba alcohol, especialmente si est amamantando o toma analgsicos.  Nomastique tabaco ni fume.  Contine con un adecuado cuidado perineal. El buen cuidado perineal incluye:  Higienizarse de adelante hacia atrs.  Mantener la zona perineal limpia.  Controlar diariamente el corte (incisin) y observar si aumenta el enrojecimiento, si supura, se hincha o se separa la piel.  Limpie la incisin suavemente con jabn y agua todos los das, y luego squela dando golpecitos. Si el mdico la autoriza, deje la incisin al descubierto. Use un apsito (vendaje) si drena lquido o la incisin parece irritada. Si las pequeas tiras [facility] que cruzan la incisin no se caen dentro de los 7 das, retrelas suavemente.  Abrace una almohada al toser o estornudar hasta que la incisin se cure. Esto ayuda a Best boy.  No conduzca vehculos ni opere maquinarias hasta que el mdico la autorice.  Dchese, lvese el cabello y tome baos de inmersin segn las indicaciones de su mdico.  Utilice un sostn que le ajuste bien y que brinde buen soporte a sus Glass blower/designer.  Limite el uso de bombachas de sostn o medias panty.  Beba suficiente lquido para Consulting civil engineer orina clara o de color amarillo plido.  Consuma todos los das alimentos ricos en fibra como cereales y panes  [place], arroz, frijoles y frutas frescas y verduras. Estos alimentos pueden ayudarla a prevenir o Cytogeneticist.  Reanude las actividades como subir escaleras, conducir automviles, levantar objetos pesados, hacer ejercicios o viajar cuando le indique su mdico.  Hable con su mdico acerca de reanudar la actividad sexual. Volver a la actividad sexual depende del riesgo de infeccin, la velocidad de la curacin y la comodidad y su deseo de Financial controller.  Trate de que alguien la ayude con las actividades del hogar y con el recin nacido al menos durante algunos das despus de salir del hospital.  Descanse todo lo que pueda. Trate de descansar o tomar una siesta mientras el beb est durmiendo.  Aumente sus actividades gradualmente.  Cumpla con todos los controles programados para despus del [place]. Es muy importante asistir a todas las visitas de Nurse, adult. En estas visitas, su mdico va a controlarla para asegurarse de que est sanando fsica y emocionalmente. SOLICITE ATENCIN MDICA SI:   Elimina cogulos grandes por la vagina. Guarde algunos cogulos para mostrarle al mdico.  Tiene una secrecin con feo olor que proviene de la vagina.  Tiene dificultad para orinar.  Orina con frecuencia.  Siente dolor al [employer].  Nota un cambio en sus movimientos intestinales.  Aumenta el enrojecimiento, el dolor o la hinchazn en la zona de la incisin.  Observa que supura pus en la incisin.  La incisin se abre.  Sus [employer] duelen, estn duras o enrojecidas.  Sufre un dolor intenso de [place].  Tiene visin borrosa o ve manchas.  Se siente triste o deprimida.  Tiene pensamientos acerca de lastimarse o daar al  recién nacido. °· Tiene preguntas acerca de su cuidado, la atención del recién nacido o acerca de los medicamentos. °· Se siente mareada o sufre un desmayo. °· Tiene una erupción. °· Siente dolor u observa enrojecimiento o hinchazón en el sitio en que  estaba la vía intravenosa (IV). °· Tiene náuseas o vómitos. °· Usted dejó de amamantar al bebé y no ha tenido su período menstrual dentro de las 12 semanas siguientes. °· No amamanta al bebé y no tuvo su período menstrual en las últimas 12 semanas. °· Tiene fiebre. °SOLICITE ATENCIÓN MÉDICA DE INMEDIATO SI:  °· Siente dolor persistente. °· Siente dolor en el pecho. °· Le falta el aire. °· Se desmaya. °· Siente dolor en la pierna. °· Siente dolor en el estómago. °· El sangrado vaginal satura dos o más apósitos en 1 hora. °ASEGÚRESE DE QUE:  °· Comprende estas instrucciones. °· Controlará su enfermedad. °· Recibirá ayuda de inmediato si no mejora o si empeora. °  °Esta información no tiene como fin reemplazar el consejo del médico. Asegúrese de hacerle al médico cualquier pregunta que tenga. °  °Document Released: 04/05/2005 Document Revised: 04/26/2014 °Elsevier Interactive Patient Education ©2016 Elsevier Inc. ° °

## 2015-08-22 NOTE — Progress Notes (Signed)
Pt discharged to home with sister.  Condition stable.  [facility] and [facility]  Pt ambulated to car with RN.  Pt home with rented breast pump from lactation department.  No other equipment for home ordered at discharge.

## 2015-08-25 ENCOUNTER — Other Ambulatory Visit: Payer: Self-pay

## 2015-08-28 ENCOUNTER — Other Ambulatory Visit: Payer: Self-pay

## 2015-09-02 ENCOUNTER — Other Ambulatory Visit: Payer: Self-pay | Admitting: Obstetrics & Gynecology

## 2015-09-05 ENCOUNTER — Encounter (HOSPITAL_COMMUNITY): Payer: Self-pay | Admitting: Certified Registered Nurse Anesthetist

## 2015-09-05 ENCOUNTER — Encounter (HOSPITAL_COMMUNITY): Admission: RE | Admit: 2015-09-05 | Payer: MEDICAID | Source: Ambulatory Visit

## 2015-09-08 ENCOUNTER — Encounter (HOSPITAL_COMMUNITY): Admission: RE | Payer: Self-pay | Source: Ambulatory Visit

## 2015-09-08 ENCOUNTER — Inpatient Hospital Stay (HOSPITAL_COMMUNITY)
Admission: RE | Admit: 2015-09-08 | Payer: Medicaid Other | Source: Ambulatory Visit | Admitting: Obstetrics & Gynecology

## 2015-09-08 SURGERY — Surgical Case
Anesthesia: Regional | Site: Abdomen

## 2015-09-08 SURGERY — Surgical Case
Anesthesia: *Unknown

## 2015-10-06 ENCOUNTER — Ambulatory Visit (INDEPENDENT_AMBULATORY_CARE_PROVIDER_SITE_OTHER): Payer: Medicaid Other | Admitting: Medical

## 2015-10-06 ENCOUNTER — Encounter: Payer: Self-pay | Admitting: Medical

## 2015-10-06 LAB — POCT PREGNANCY, URINE: Preg Test, Ur: NEGATIVE

## 2015-10-06 NOTE — Patient Instructions (Addendum)
Informacin sobre el dispositivo intrauterino (Intrauterine Device Information) Un dispositivo intrauterino (DIU) se inserta en el tero e impide el embarazo. Hay dos tipos de DIU:   DIU de cobre: este tipo de DIU est recubierto con un alambre de cobre y se inserta dentro del tero. El cobre hace que el tero y las trompas de [place] produzcan un liquido que destruye los espermatozoides. El DIU de cobre puede permanecer en el lugar durante 10 aos.  DIU con hormona: este tipo de DIU contiene la hormona progestina (progesterona sinttica). Las hormonas hacen que el moco cervical se haga ms espeso, lo que evita que el esperma ingrese al tero. Tambin hace que la membrana que recubre internamente al tero sea ms delgada lo que impide el implante del vulo fertilizado. La hormona debilita o destruye los espermatozoides que ingresan al tero. Alguno de los tipos de DIU hormonal pueden permanecer en el lugar durante 5 aos y otros tipos pueden dejarse en el lugar por 3 aos. El mdico se asegurar de que usted sea una buena candidata para usar el DIU. Converse con su mdico acerca de los posibles efectos secundarios.  VENTAJAS DEL DISPOSITIVO INTRAUTERINO  El DIU es muy eficaz, reversible, de accin prolongada y de bajo mantenimiento.  No hay efectos secundarios relacionados con el estrgeno.  El DIU puede ser utilizado durante la lactancia.  No est asociado con el aumento de peso.  Funciona inmediatamente despus de la insercin.  El DIU hormonal funciona inmediatamente si se inserta dentro de los 7 das del inicio del perodo. Ser necesario que utilice un mtodo anticonceptivo adicional durante 7 das si el DIU hormonal se inserta en algn otro momento del ciclo.  El DIU de cobre no interfiere con las hormonas femeninas.  El DIU hormonal puede hacer que los perodos menstruales abundantes se hagan ms ligeros y que haya menos clicos.  El DIU hormonal puede usarse durante 3 a 5  aos.  El DIU de cobre puede usarse durante 10 aos. DESVENTAJAS DEL DISPOSITIVO INTRAUTERINO  El DIU hormonal puede estar asociado con patrones de sangrado irregular.  El DIU de cobre puede hacer que el flujo menstrual ms abundante y doloroso.  Puede experimentar clicos y sangrado vaginal despus de la insercin.   Esta informacin no tiene como fin reemplazar el consejo del mdico. Asegrese de hacerle al mdico cualquier pregunta que tenga.   Document Released: 09/23/2009 Document Revised: 12/06/2012 Elsevier Interactive Patient Education 2016 Elsevier Inc.  

## 2015-10-06 NOTE — Progress Notes (Signed)
Patient ID: [name], female   DOB: [birth year], 35 y.o.   MRN: [id number] Subjective:     [name] is a 35 y.o. female who presents for a postpartum visit. She is 7 weeks postpartum following a delivery on 08/20/15. I have fully reviewed the prenatal and intrapartum course. The delivery was at 34 gestational weeks. Outcome: C-section. Anesthesia: Spinal. Postpartum course has been uncomplicated. Baby's course has been uncomplicated. Baby is feeding by breast feeding and bottle feeding. Formula - Similac for preemies. Bleeding none . Bowel function is normal. Bladder function is normal . Patient is not sexually active. Contraception method is abstinence. Postpartum depression screening: negative.  The following portions of the patient's history were reviewed and updated as appropriate: allergies, current medications, past family history, past medical history, past social history, past surgical history and problem list.  Review of Systems Pertinent items are noted in HPI.   Objective:    BP 106/66 mmHg  Pulse 67  Wt 125 lb 11.2 oz (57.017 kg)  General:  alert and cooperative   Breasts:  not performed  Lungs: clear to auscultation bilaterally  Heart:  regular rate and rhythm, S1, S2 normal, no murmur, click, rub or gallop  Abdomen: soft, non-tender, no masses or organomegaly Surgical incision well healed without significant edema, erythema. No drainage or bleeding   Vulva:  not evaluated  Vagina: not evaluated  Cervix:  not evaluated  Corpus: not examined  Adnexa:  Not examined  Rectal Exam: Not performed.          Assessment:     Normal postpartum exam. Pap smear not done at today's visit.  Pap normal during pregnancy.   Plan:    1. Contraception: abstinence/condoms until IUD insertion 2. Application for free IUD completed and sent. Our office will contact patient when IUD is received to make an appointment for insertion.  3. Follow up when IUD is received in the  office for insertion or sooner as needed.    Marny LowensteinJulie N Wenzel, PA-C  10/06/2015 1:37 PM

## 2017-09-07 ENCOUNTER — Other Ambulatory Visit: Payer: Self-pay

## 2017-09-07 ENCOUNTER — Encounter (HOSPITAL_COMMUNITY): Payer: Self-pay | Admitting: *Deleted

## 2017-09-07 ENCOUNTER — Inpatient Hospital Stay (HOSPITAL_COMMUNITY)
Admission: AD | Admit: 2017-09-07 | Discharge: 2017-09-07 | Disposition: A | Discharge status: Home / Self Care | Payer: Self-pay | Source: Ambulatory Visit | Attending: Obstetrics and Gynecology | Admitting: Obstetrics and Gynecology

## 2017-09-07 ENCOUNTER — Inpatient Hospital Stay (HOSPITAL_COMMUNITY): Payer: Self-pay

## 2017-09-07 DIAGNOSIS — O3680X0 Pregnancy with inconclusive fetal viability, not applicable or unspecified: Secondary | ICD-10-CM

## 2017-09-07 DIAGNOSIS — Z3A13 13 weeks gestation of pregnancy: Secondary | ICD-10-CM | POA: Insufficient documentation

## 2017-09-07 DIAGNOSIS — R109 Unspecified abdominal pain: Secondary | ICD-10-CM | POA: Insufficient documentation

## 2017-09-07 DIAGNOSIS — F17210 Nicotine dependence, cigarettes, uncomplicated: Secondary | ICD-10-CM | POA: Insufficient documentation

## 2017-09-07 DIAGNOSIS — O99331 Smoking (tobacco) complicating pregnancy, first trimester: Secondary | ICD-10-CM | POA: Insufficient documentation

## 2017-09-07 DIAGNOSIS — O26891 Other specified pregnancy related conditions, first trimester: Secondary | ICD-10-CM

## 2017-09-07 DIAGNOSIS — O209 Hemorrhage in early pregnancy, unspecified: Secondary | ICD-10-CM | POA: Insufficient documentation

## 2017-09-07 HISTORY — DX: Major depressive disorder, single episode, unspecified: F32.9

## 2017-09-07 HISTORY — DX: Depression, unspecified: F32.A

## 2017-09-07 HISTORY — DX: Gestational (pregnancy-induced) hypertension without significant proteinuria, unspecified trimester: O13.9

## 2017-09-07 LAB — URINALYSIS, ROUTINE W REFLEX MICROSCOPIC
Bilirubin Urine: NEGATIVE
Glucose, UA: NEGATIVE mg/dL
Ketones, ur: NEGATIVE mg/dL
Leukocytes, UA: NEGATIVE
NITRITE: POSITIVE — AB
Protein, ur: NEGATIVE mg/dL
Specific Gravity, Urine: 1.009 (ref 1.005–1.030)
pH: 7 (ref 5.0–8.0)

## 2017-09-07 LAB — CBC
HEMATOCRIT: 38.9 % (ref 36.0–46.0)
Hemoglobin: 13 g/dL (ref 12.0–15.0)
MCH: 30 pg (ref 26.0–34.0)
MCHC: 33.4 g/dL (ref 30.0–36.0)
MCV: 89.8 fL (ref 78.0–100.0)
Platelets: 195 10*3/uL (ref 150–400)
RBC: 4.33 MIL/uL (ref 3.87–5.11)
RDW: 12.4 % (ref 11.5–15.5)
WBC: 4.2 10*3/uL (ref 4.0–10.5)

## 2017-09-07 LAB — HCG, QUANTITATIVE, PREGNANCY: hCG, Beta Chain, Quant, S: 153 m[IU]/mL — ABNORMAL HIGH (ref <5)

## 2017-09-07 MED ORDER — ACETAMINOPHEN 500 MG PO TABS
500.0000 mg | ORAL_TABLET | Freq: Once | ORAL | Status: AC
  Administered 2017-09-07: 500 mg via ORAL
  Filled 2017-09-07: qty 1

## 2017-09-07 NOTE — Discharge Instructions (Signed)
Please follow-up in our clinic on Friday for repeat blood work at [facility]!

## 2017-09-07 NOTE — MAU Note (Signed)
Went to a [facility] clinic today, +HPT- hx of problems with other 2 preg.  On [place], ? Ectopic preg, instructed to go to hosp.  Started bleeding day before yesterday, getting heavier. Pain started then also.  Pain in RLQ and HA.

## 2017-09-07 NOTE — MAU Provider Note (Signed)
Chief Complaint: Vaginal Bleeding; Abdominal Pain; and Possible Pregnancy   SUBJECTIVE HPI: [name] is a 37 y.o. [id number] at [redacted]w[redacted]d who presents [facility] from [facility] for possible [facility] pregnancy.  Patient states that she went to [facility] yesterday due to having a positive home pregnancy test with known history of complicated pregnancies.  Patient states she was also having some bleeding and cramping that had been present since Monday.  States that bleeding is a little bit more than a period.  Patient has prenatal history significant for 2 miscarriages and 2 terminations of pregnancy.  No history of ectopic. Patient's last menstrual period was 06/04/2017.  While at [facility] had an ultrasound which showed pregnancy of unknown location with right adnexal mass concerning for probable ectopic pregnancy.   Past Medical History:  Diagnosis Date  . Depression    PP  . Herpes simplex    diagnosed in [place], no recent breakouts  . Hypothyroidism   . Mental disorder    Post partum depression/anxiety  . Pregnancy induced hypertension   . Thyroid disease    hypothyroid   OB History  Gravida Para Term Preterm AB Living  SAB TAB Ectopic Multiple Live Births  2 2 0 0 2    # Outcome Date GA Lbr Len/2nd Weight Sex Delivery Anes PTL Lv  7 Current           6 Preterm 08/20/15 [redacted]w[redacted]d  2.38 kg (5 lb 4 oz) M CS-LTranv Spinal  LIV  5 Term 02/27/10     CS-Unspec   LIV  4 TAB           3 TAB           2 SAB           1 SAB             Obstetric Comments  3 spontaneous abortions all at [redacted] weeks gestation  Last pregnancy 5 years ago (daughter born at 50 weeks)  Primary c/s for pre-eclampsia, repeat had a previa   Past Surgical History:  Procedure Laterality Date  . CESAREAN SECTION    . CESAREAN SECTION N/A 08/20/2015   Procedure: CESAREAN SECTION;  Surgeon: Lazaro Arms, MD;  Location: [facility];  Service: Obstetrics;  Laterality: N/A;   . CHOLECYSTECTOMY    . DILATION AND CURETTAGE OF UTERUS     Social History   Socioeconomic History  . Marital status: Single    Spouse name: Not on file  . Number of children: Not on file  . Years of education: Not on file  . Highest education level: Not on file  Occupational History  . Not on file  Social Needs  . Financial resource strain: Not on file  . Food insecurity:    Worry: Not on file    Inability: Not on file  . Transportation needs:    Medical: Not on file    Non-medical: Not on file  Tobacco Use  . Smoking status: Current Every Day Smoker    Packs/day: 0.25    Years: 8.00    Pack years: 2.00    Types: Cigarettes  . Smokeless tobacco: Never Used  Substance and Sexual Activity  . Alcohol use: No  . Drug use: No  . Sexual activity: Yes    Birth control/protection: None  Lifestyle  . Physical activity:    Days per week: Not on file    Minutes  per session: Not on file  . Stress: Not on file  Relationships  . Social connections:    Talks on phone: Not on file    Gets together: Not on file    Attends religious service: Not on file    Active member of club or organization: Not on file    Attends meetings of clubs or organizations: Not on file    Relationship status: Not on file  . Intimate partner violence:    Fear of current or ex partner: Not on file    Emotionally abused: Not on file    Physically abused: Not on file    Forced sexual activity: Not on file  Other Topics Concern  . Not on file  Social History Narrative  . Not on file   No current facility-administered medications on file prior to encounter.    Current Outpatient Medications on File Prior to Encounter  Medication Sig Dispense Refill  . [DISCONTINUED] norgestimate-ethinyl estradiol (ORTHO-CYCLEN,SPRINTEC,PREVIFEM) 0.25-35 MG-MCG tablet Take 1 tablet by mouth daily. 30 tablet 12   No Known Allergies  I have reviewed the past Medical Hx, Surgical Hx, Social Hx, Allergies and  Medications.   REVIEW OF SYSTEMS All systems reviewed and are negative for acute change except as noted in the HPI.   OBJECTIVE BP 101/65   Pulse 62   Temp 98.3 F (36.8 C) (Oral)   Resp 16   Wt 55.6 kg (122 lb 8 oz)   LMP 06/04/2017 Comment: has bled 2 days every month since then  SpO2 100%   BMI 22.96 kg/m    PHYSICAL EXAM Constitutional: Well-developed, well-nourished female in no acute distress.  Cardiovascular: normal rate and rhythm, pulses intact Respiratory: normal rate and effort.  GI: Abd soft, tender to RLQ, non-distended.  MS: Extremities nontender, no edema, normal ROM Neurologic: Alert and oriented x 4. No focal deficits Pelvic: light blood appreciated, normal psychologic discharge, cervix closed and thick BIMANUAL: cervix closed; uterus normal size, right adnexal tenderness but no masses. No CMT. Psych: normal mood and affect  LAB RESULTS Results for orders placed or performed during the hospital encounter of 09/07/17 (from the past 24 hour(s))  Urinalysis, Routine w reflex microscopic     Status: Abnormal   Collection Time: 09/07/17 10:00 AM  Result Value Ref Range   Color, Urine YELLOW YELLOW   APPearance HAZY (A) CLEAR   Specific Gravity, Urine 1.009 1.005 - 1.030   pH 7.0 5.0 - 8.0   Glucose, UA NEGATIVE NEGATIVE mg/dL   Hgb urine dipstick LARGE (A) NEGATIVE   Bilirubin Urine NEGATIVE NEGATIVE   Ketones, ur NEGATIVE NEGATIVE mg/dL   Protein, ur NEGATIVE NEGATIVE mg/dL   Nitrite POSITIVE (A) NEGATIVE   Leukocytes, UA NEGATIVE NEGATIVE   RBC / HPF 0-5 0 - 5 RBC/hpf   WBC, UA 0-5 0 - 5 WBC/hpf   Bacteria, UA MANY (A) NONE SEEN   Squamous Epithelial / LPF 0-5 0 - 5   Mucus PRESENT   CBC     Status: None   Collection Time: 09/07/17 10:31 AM  Result Value Ref Range   WBC 4.2 4.0 - 10.5 K/uL   RBC 4.33 3.87 - 5.11 MIL/uL   Hemoglobin 13.0 12.0 - 15.0 g/dL   HCT 16.1 09.6 - 04.5 %   MCV 89.8 78.0 - 100.0 fL   MCH 30.0 26.0 - 34.0 pg   MCHC 33.4  30.0 - 36.0 g/dL   RDW 40.9 81.1 - 91.4 %  Platelets 195 150 - 400 K/uL  hCG, quantitative, pregnancy     Status: Abnormal   Collection Time: 09/07/17 10:31 AM  Result Value Ref Range   hCG, Beta Chain, Quant, S 153 (H) <5 mIU/mL    IMAGING US Ob Comp Less 14 Wks  Result Date: 09/07/2017 CLINICAL DATA:  Vaginal bleeding. EXAM: OBSTETRIC <14 WK [place] AND TRANSVAGINAL OB US TECHNIQUE: Both transabdominal and transvaginal ultrasound examinations were performed for complete evaluation of the gestation as well as the maternal uterus, adnexal regions, and pelvic cul-de-sac. Transvaginal technique was performed to assess early pregnancy. COMPARISON:  None FINDINGS: Intrauterine gestational sac: None Yolk sac:  Not Visualized. Embryo:  Not Visualized. Cardiac Activity: Not Visualized. Maternal uterus/adnexae: Right ovary: Normal Left ovary: Normal Other :The endometrium has a heterogeneous appearance measuring 11.4 in thickness. There is increased blood flow within the thickened endometrium. Free fluid:  No free fluid noted. IMPRESSION: 1. No intrauterine gestational sac, yolk sac, or fetal pole identified. Differential considerations include intrauterine pregnancy too early to be sonographically visualized, missed abortion, or ectopic pregnancy. Followup ultrasound is recommended in 10-14 days for further evaluation. 2. Heterogeneous endometrium with increased blood flow measures 11.4 mm in thickness. Clinical correlation for any clinical signs or symptoms of retained products of conception advised. Electronically Signed   By: Signa Kell M.D.   On: 09/07/2017 12:34   US Ob Transvaginal  Result Date: 09/07/2017 CLINICAL DATA:  Vaginal bleeding. EXAM: OBSTETRIC <14 WK [place] AND TRANSVAGINAL OB US TECHNIQUE: Both transabdominal and transvaginal ultrasound examinations were performed for complete evaluation of the gestation as well as the maternal uterus, adnexal regions, and pelvic cul-de-sac. Transvaginal  technique was performed to assess early pregnancy. COMPARISON:  None FINDINGS: Intrauterine gestational sac: None Yolk sac:  Not Visualized. Embryo:  Not Visualized. Cardiac Activity: Not Visualized. Maternal uterus/adnexae: Right ovary: Normal Left ovary: Normal Other :The endometrium has a heterogeneous appearance measuring 11.4 in thickness. There is increased blood flow within the thickened endometrium. Free fluid:  No free fluid noted. IMPRESSION: 1. No intrauterine gestational sac, yolk sac, or fetal pole identified. Differential considerations include intrauterine pregnancy too early to be sonographically visualized, missed abortion, or ectopic pregnancy. Followup ultrasound is recommended in 10-14 days for further evaluation. 2. Heterogeneous endometrium with increased blood flow measures 11.4 mm in thickness. Clinical correlation for any clinical signs or symptoms of retained products of conception advised. Electronically Signed   By: Signa Kell M.D.   On: 09/07/2017 12:34    MAU Management/MDM: Vitals and nursing notes reviewed Orders Placed This Encounter  Procedures  . US OB Comp Less 14 Wks  . US OB Transvaginal  . Urinalysis, Routine w reflex microscopic  . CBC  . hCG, quantitative, pregnancy  . Diet NPO time specified  . Discharge patient Discharge disposition: 01-Home or Self Care; Discharge patient date: 09/07/2017    Meds ordered this encounter  Medications  . acetaminophen (TYLENOL) tablet 500 mg    Patient sent from planned parent hood to rule out ectopic. Hcg low for possible GA. Could be a pregnancy too early to detect. [place] did not show any signs of conception; patient did have thickened endometrium and R. ovarian cyst. DDx includes pregnancy too early to detect, missed AB, incomplete AB, or ectopic pregnancy. VSS and lab work stable. Patient to present to clinic on Friday for repeat hcg level.  Plan of care reviewed with patient, including labs and tests ordered and  medical treatment.  Consult Dr. Jolayne Panther.  Treatments in MAU included: Tylenol.   ASSESSMENT 1. Pregnancy of unknown anatomic location   2. Vaginal bleeding in pregnancy, first trimester   3. Abdominal pain during pregnancy in first trimester     PLAN Discharge home in stable condition. Pt discharged with strict return precautions. Follow-up in clinic for stat repeat hcg. Further plan of care to be determined at that time.  Handout given   Allergies as of 09/07/2017   No Known Allergies     Medication List    You have not been prescribed any medications.      Caryl Ada, DO [facility] Fellow [facility], [facility] 09/07/2017, 1:02 PM

## 2017-09-09 ENCOUNTER — Ambulatory Visit: Payer: Self-pay

## 2017-09-13 ENCOUNTER — Ambulatory Visit: Payer: Self-pay

## 2018-01-10 IMAGING — US US MFM OB TRANSVAGINAL
1 series · 14 of 28 positions shown · non-contrast
Comparison: none

[Series 1: us mfm ob transvaginal · 48 acquisitions, 14 frames shown]
[im 2/48]
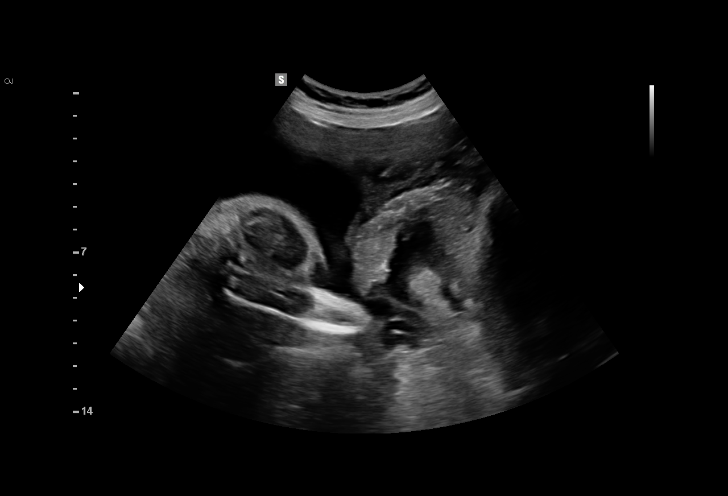
[im 6/48]
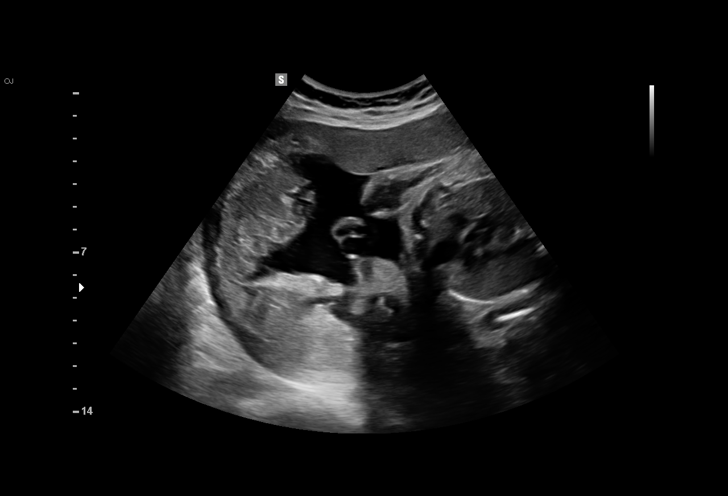
[im 9/48]
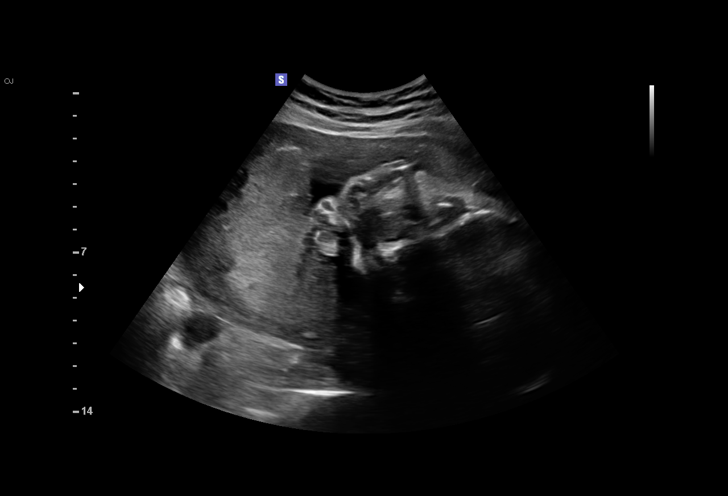
[im 13/48]
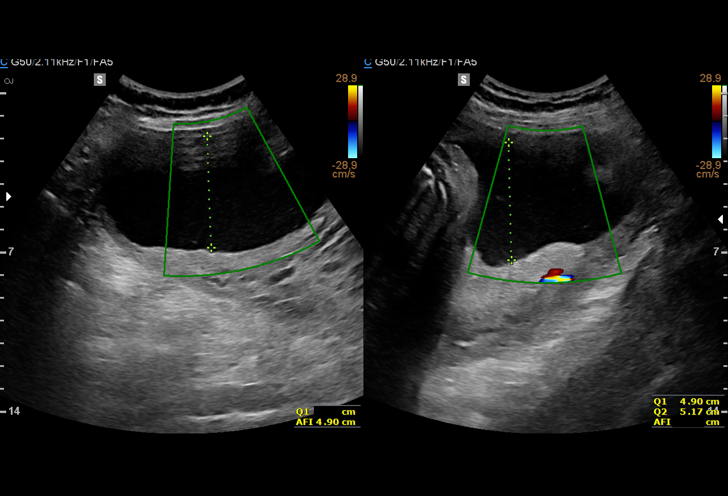
[im 16/48]
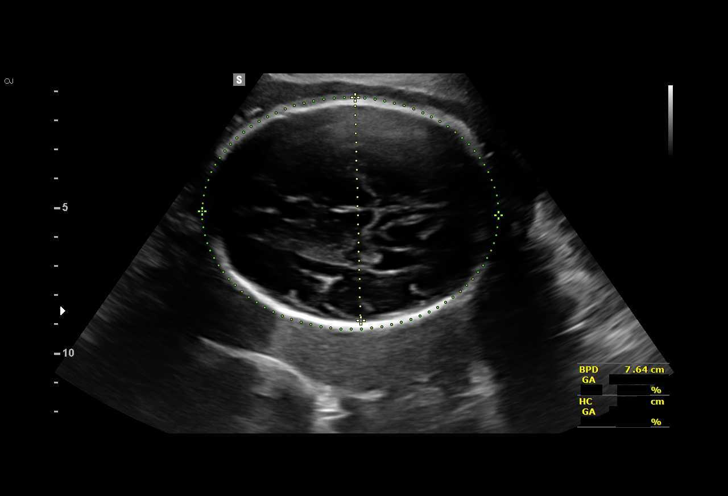
[im 20/48]
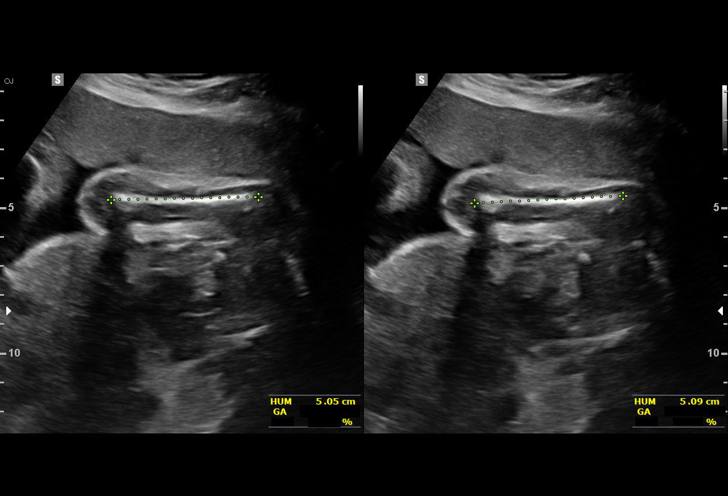
[im 23/48]
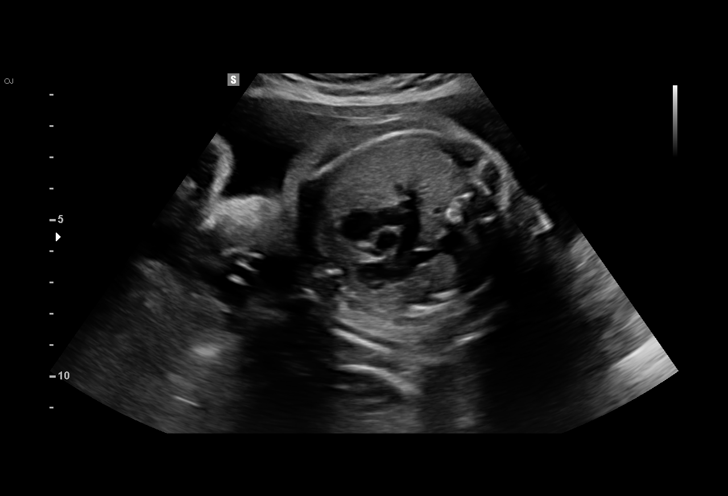
[im 27/48]
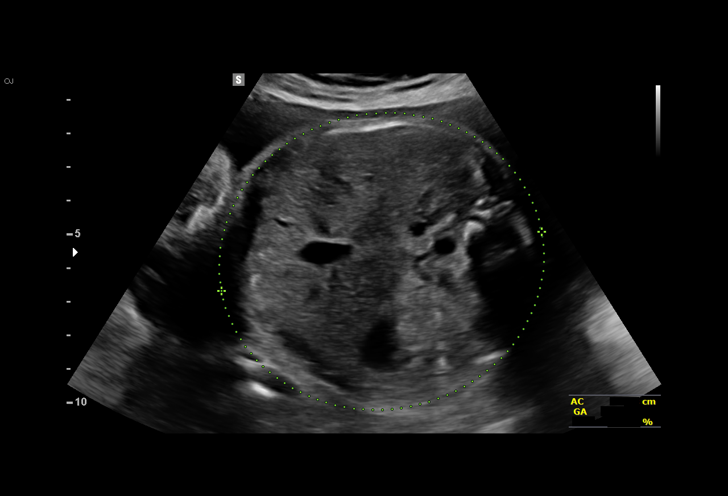
[im 30/48]
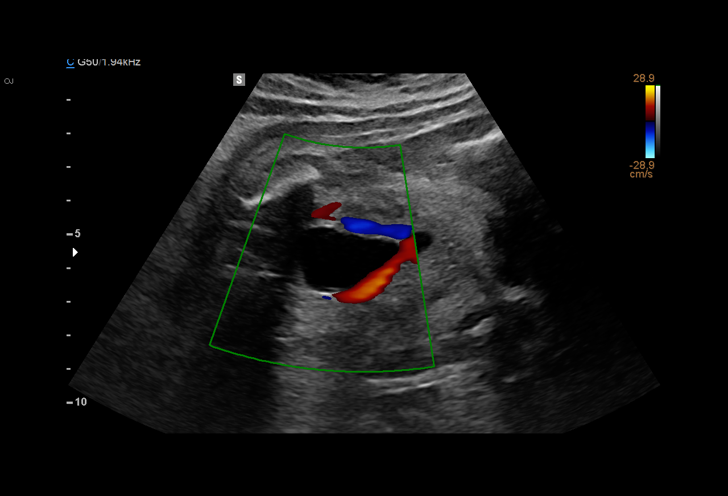
[im 34/48]
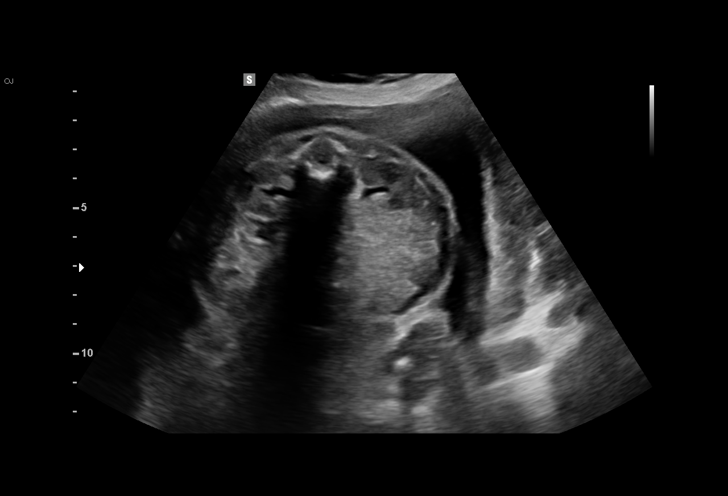
[im 37/48]
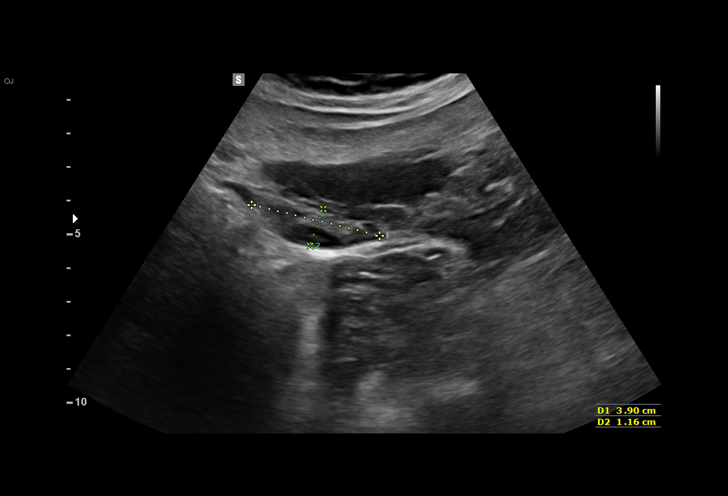
[im 41/48]
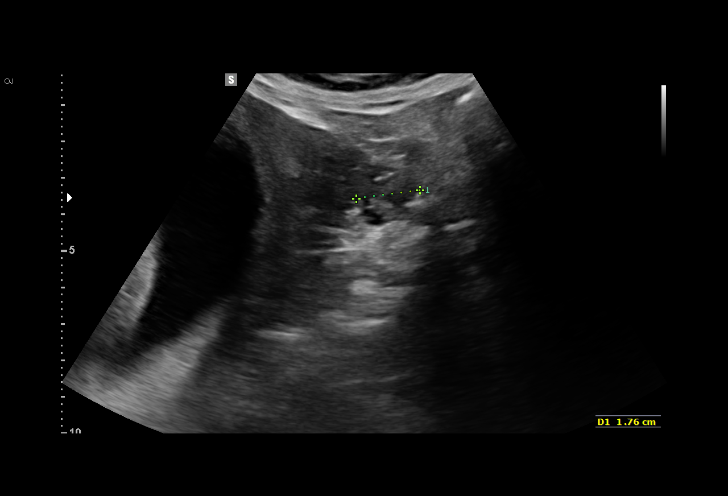
[im 44/48]
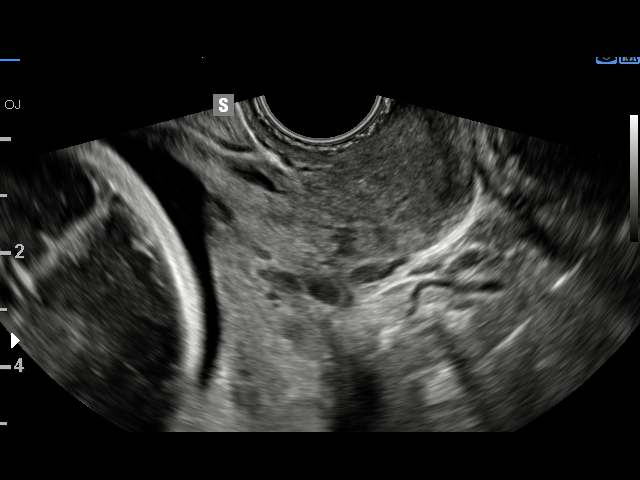
[im 48/48]
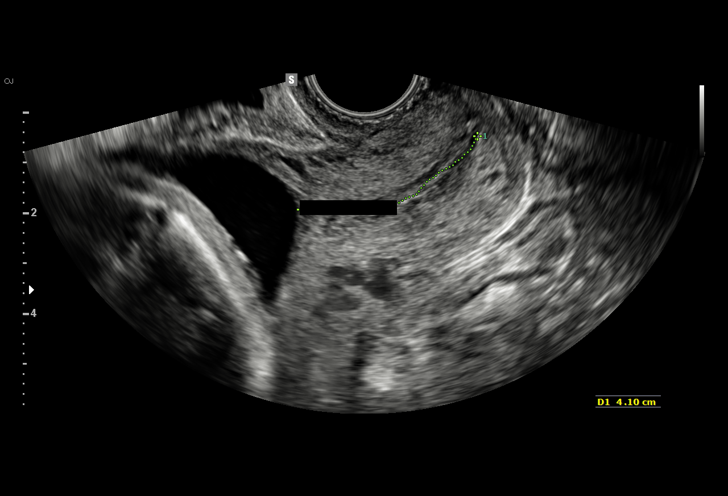

[14 of 28 positions shown; findings below may reference images not displayed]

1  TIGER                 [id number]      [id number]     [id number]
NAYARA
2  TIGER                 [id number]      [id number]     [id number]
NAYARA
Indications

30 weeks gestation of pregnancy
Placenta previa specified as without
hemorrhage, third trimester
Advanced maternal age multigravida 35+,
third trimester
Poor obstetric history: Previous
preeclampsia / eclampsia/gestational HTN
OB History

Gravidity:    6         Term:   1        Prem:   0         SAB:   3
TOP:          1       Ectopic:  0        Living: 1
Fetal Evaluation

Num Of Fetuses:     1
Cardiac Activity:   Observed
Presentation:       Cephalic
Placenta:           Posterior, above cervical os
P. Cord Insertion:  Previously Visualized

Amniotic Fluid
AFI FV:      Subjectively within normal limits
AFI Sum:     13       cm      38  %Tile      Larg Pckt:    5.2  cm
RUQ:   2.9     cm   LUQ:    4.9    cm    LLQ:   5.2     cm
Biometry

BPD:      77.9  mm     G. Age:  31w 2d                  CI:         71.52  %    70 - 86
FL/HC:       19.4  %    19.2 -
HC:      293.3  mm     G. Age:  32w 3d         74  %    HC/AC:       1.01       0.99 -
AC:      289.8  mm     G. Age:  33w 0d       > 97  %    FL/BPD:      73.0  %    71 - 87
FL:       56.9  mm     G. Age:  29w 6d         24  %    FL/AC:       19.6  %    20 - 24

Est. FW:    1671   gm     4 lb 1 oz     79  %
Gestational Age

U/S Today:     31w 5d                                        EDD:    09/19/15
Best:          30w 2d     Det. By:  Early Ultrasound         EDD:    09/29/15
Anatomy

Cranium:          Appears normal         LVOT:             Appears normal
Fetal Cavum:      Appears normal         Aortic Arch:      Previously seen
Ventricles:       Appears normal         Ductal Arch:      Previously seen
Choroid Plexus:   Previously seen        Diaphragm:        Previously seen
Cerebellum:       Appears normal         Stomach:          Appears normal, left
sided
Posterior Fossa:  Appears normal         Abdomen:          Appears normal
Nuchal Fold:      Not applicable (>20    Abdominal Wall:   Previously seen
wks GA)
Face:             Orbits and profile     Cord Vessels:     Appears normal (3
previously seen                          vessel cord)
Lips:             Previously seen        Kidneys:          Appear normal
Palate:           Previously seen        Bladder:          Appears normal
Fetal Thoracic:   Previously seen        Spine:            Previously seen
Heart:            Previously seen        Upper             Previously seen
Extremities:
RVOT:             Appears normal         Lower             Previously seen
Extremities:

Other:  Heels prev seen.  Fetus appears to be a male.
Cervix Uterus Adnexa

Cervix
Length:            4.1  cm.
Normal appearance by transvaginal scan
Impression

SIUP at 30+2 weeks
Normal interval anatomy; anatomic survey complete
Normal amniotic fluid volume
Appropriate interval growth with EFW at the 79th %tile; AC >
97th %tiel
EV views of cervix: normal length without funneling
Posterior placenta; no previa
Recommendations

Follow-up as clinically indicated

## 2018-06-03 ENCOUNTER — Encounter (HOSPITAL_COMMUNITY): Payer: Self-pay

## 2018-10-23 ENCOUNTER — Other Ambulatory Visit: Payer: Self-pay | Admitting: *Deleted

## 2018-10-23 DIAGNOSIS — Z20822 Contact with and (suspected) exposure to covid-19: Secondary | ICD-10-CM

## 2018-10-24 ENCOUNTER — Other Ambulatory Visit: Payer: Self-pay | Admitting: *Deleted

## 2018-10-24 NOTE — Progress Notes (Signed)
lab7 

## 2018-10-29 LAB — NOVEL CORONAVIRUS, NAA: SARS-CoV-2, NAA: NOT DETECTED

## 2020-02-26 IMAGING — US US OB TRANSVAGINAL
1 series · 15 of 28 positions shown · non-contrast
Comparison: None

CLINICAL DATA: Vaginal bleeding.

EXAM:
OBSTETRIC <14 WK US AND TRANSVAGINAL OB US
TECHNIQUE: Both transabdominal and transvaginal ultrasound examinations were
performed for complete evaluation of the gestation as well as the
maternal uterus, adnexal regions, and pelvic cul-de-sac.
Transvaginal technique was performed to assess early pregnancy.

[Series 1: us ob transvaginal · 51 acquisitions, 15 frames shown]
[im 1/51]
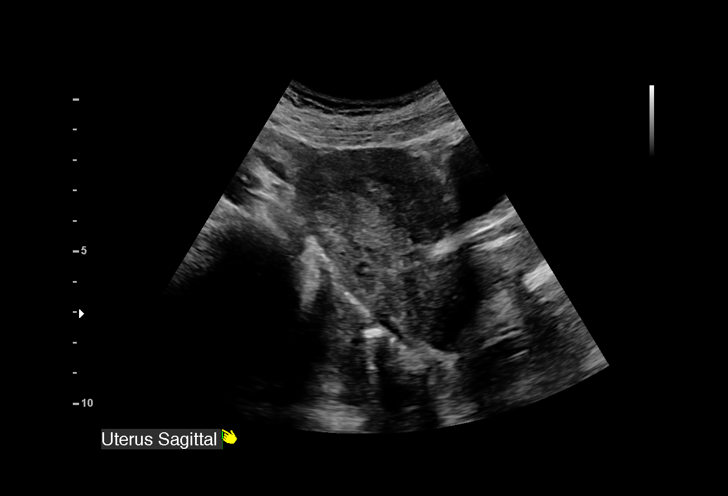
[im 4/51]
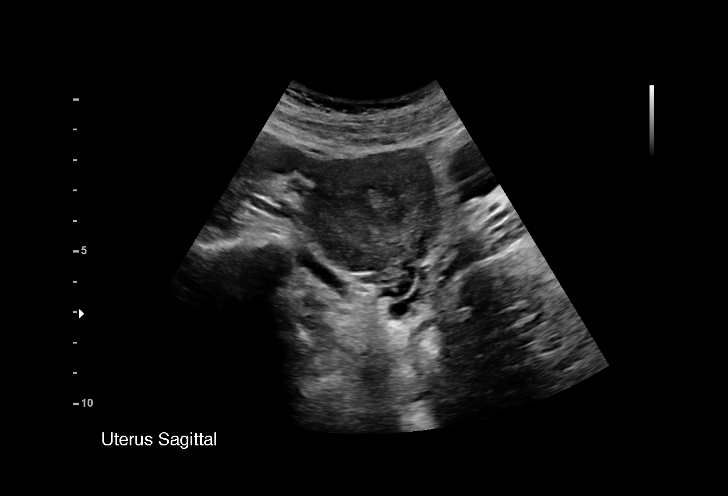
[im 8/51]
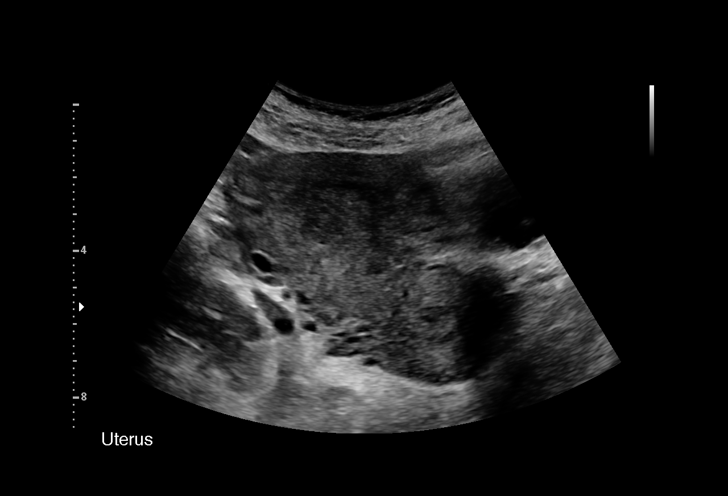
[im 12/51]
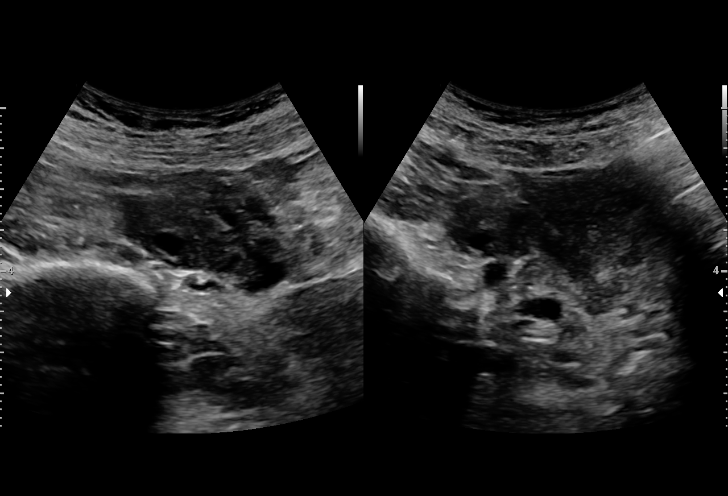
[im 15/51]
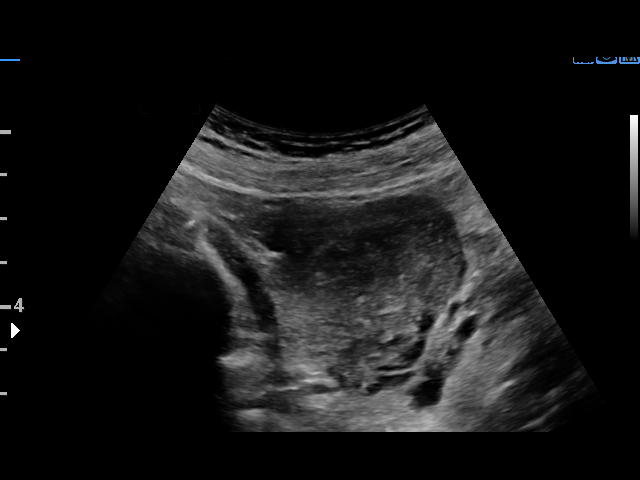
[im 19/51]
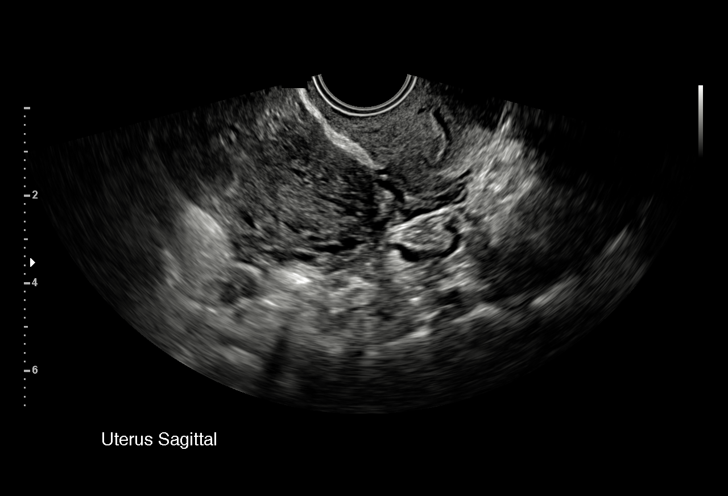
[im 23/51]
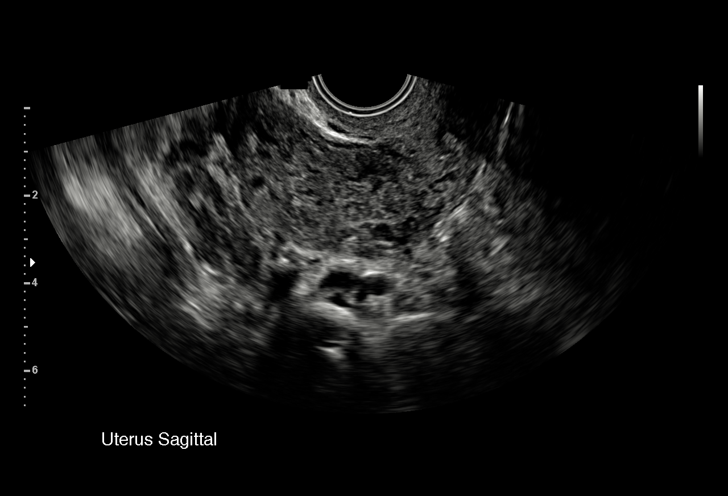
[im 26/51]
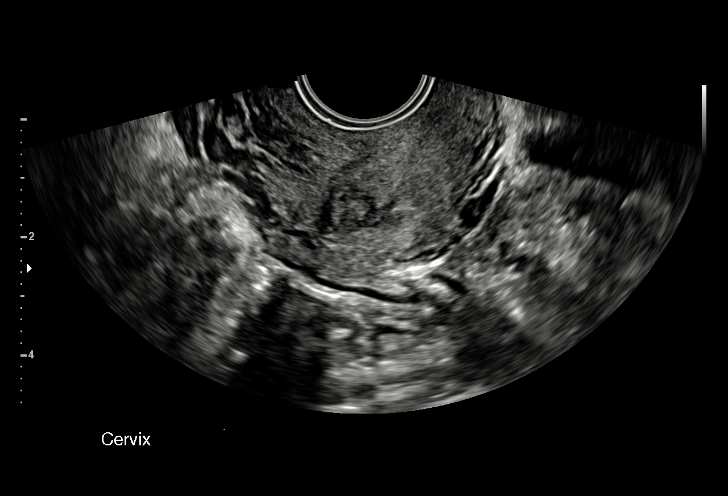
[im 28/51]
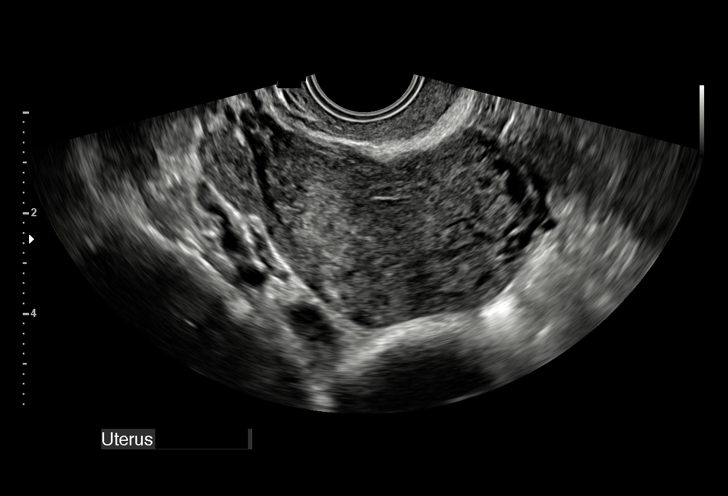
[im 32/51]
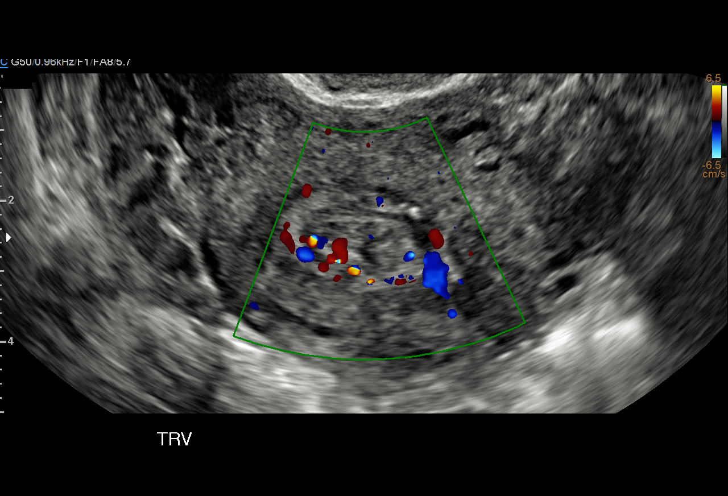
[im 36/51]
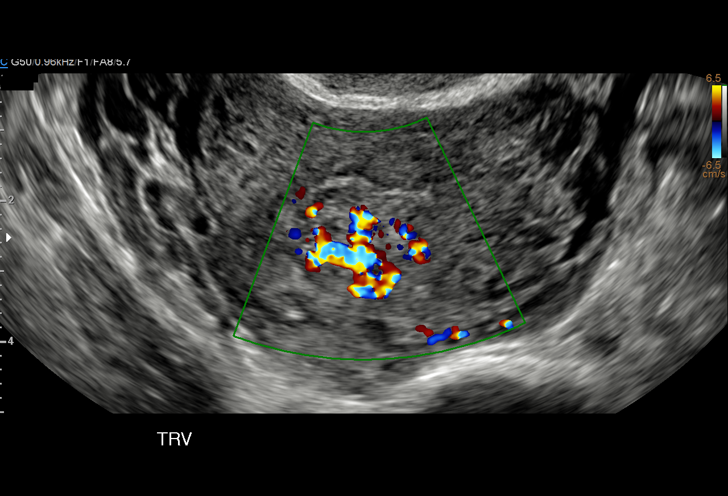
[im 39/51]
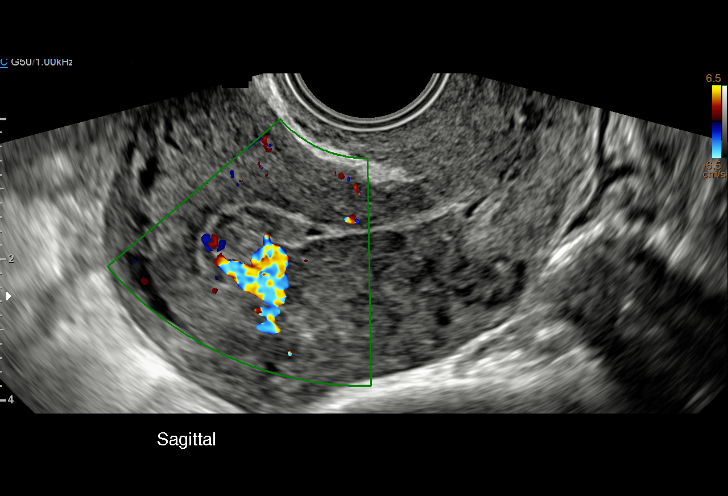
[im 43/51]
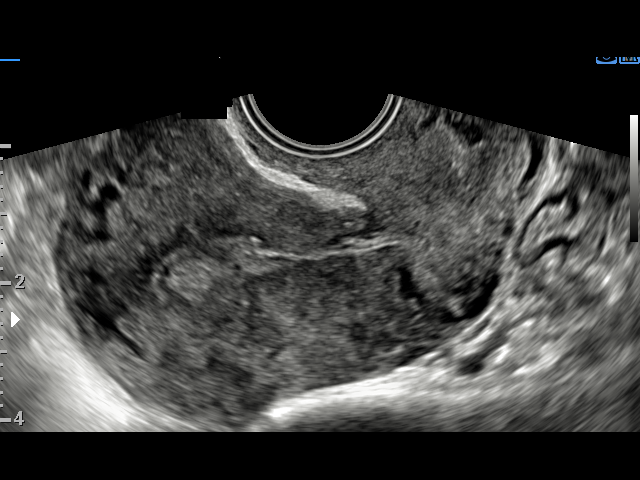
[im 47/51]
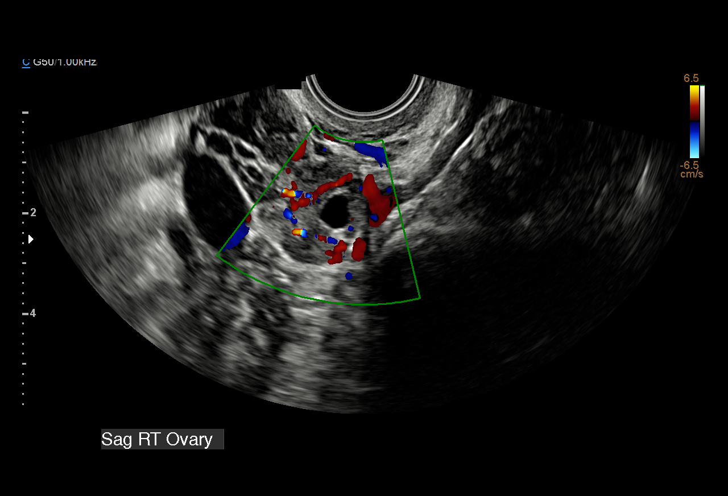
[im 51/51]
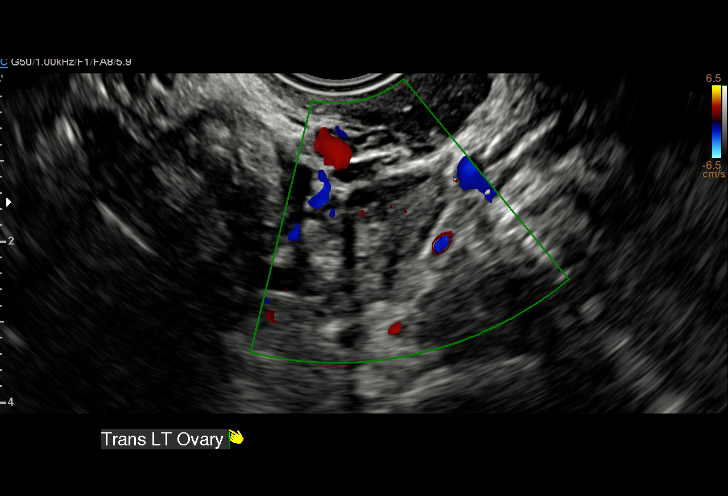

[15 of 28 positions shown; findings below may reference images not displayed]

FINDINGS: Intrauterine gestational sac: None

Yolk sac:  Not Visualized.

Embryo:  Not Visualized.

Cardiac Activity: Not Visualized.

Maternal uterus/adnexae:

Right ovary: Normal

Left ovary: Normal

Other :The endometrium has a heterogeneous appearance measuring
in thickness. There is increased blood flow within the thickened
endometrium.

Free fluid:  No free fluid noted.
IMPRESSION: 1. No intrauterine gestational sac, yolk sac, or fetal pole
identified. Differential considerations include intrauterine
pregnancy too early to be sonographically visualized, missed
abortion, or ectopic pregnancy. Followup ultrasound is recommended
in 10-14 days for further evaluation.
2. Heterogeneous endometrium with increased blood flow measures
mm in thickness. Clinical correlation for any clinical signs or
symptoms of retained products of conception advised..
